# Patient Record
Sex: Female | Born: 1990
Health system: Southern US, Community
[De-identification: ages and names within clinical notes are randomized; demographics above are authoritative.]

## PROBLEM LIST (undated history)

## (undated) ENCOUNTER — Inpatient Hospital Stay (HOSPITAL_COMMUNITY): Payer: Self-pay

## (undated) HISTORY — PX: WISDOM TOOTH EXTRACTION: SHX21

## (undated) HISTORY — PX: HERNIA REPAIR: SHX51

---

## 2014-10-18 LAB — OB RESULTS CONSOLE GC/CHLAMYDIA
CHLAMYDIA, DNA PROBE: NEGATIVE
CHLAMYDIA, DNA PROBE: NEGATIVE
GC PROBE AMP, GENITAL: NEGATIVE
Gonorrhea: NEGATIVE

## 2015-01-20 DIAGNOSIS — Z3401 Encounter for supervision of normal first pregnancy, first trimester: Secondary | ICD-10-CM | POA: Diagnosis not present

## 2015-01-20 DIAGNOSIS — Z36 Encounter for antenatal screening of mother: Secondary | ICD-10-CM | POA: Diagnosis not present

## 2015-01-20 DIAGNOSIS — Z3A09 9 weeks gestation of pregnancy: Secondary | ICD-10-CM | POA: Diagnosis not present

## 2015-01-20 LAB — OB RESULTS CONSOLE HIV ANTIBODY (ROUTINE TESTING): HIV: NONREACTIVE

## 2015-01-20 LAB — OB RESULTS CONSOLE HEPATITIS B SURFACE ANTIGEN: Hepatitis B Surface Ag: NEGATIVE

## 2015-01-20 LAB — OB RESULTS CONSOLE RUBELLA ANTIBODY, IGM: Rubella: IMMUNE

## 2015-02-01 DIAGNOSIS — Z3401 Encounter for supervision of normal first pregnancy, first trimester: Secondary | ICD-10-CM | POA: Diagnosis not present

## 2015-02-08 DIAGNOSIS — Z36 Encounter for antenatal screening of mother: Secondary | ICD-10-CM | POA: Diagnosis not present

## 2015-02-08 DIAGNOSIS — Z3491 Encounter for supervision of normal pregnancy, unspecified, first trimester: Secondary | ICD-10-CM | POA: Diagnosis not present

## 2015-03-01 DIAGNOSIS — Z36 Encounter for antenatal screening of mother: Secondary | ICD-10-CM | POA: Diagnosis not present

## 2015-03-30 DIAGNOSIS — Z3400 Encounter for supervision of normal first pregnancy, unspecified trimester: Secondary | ICD-10-CM | POA: Diagnosis not present

## 2015-03-30 DIAGNOSIS — Z36 Encounter for antenatal screening of mother: Secondary | ICD-10-CM | POA: Diagnosis not present

## 2015-05-27 DIAGNOSIS — Z23 Encounter for immunization: Secondary | ICD-10-CM | POA: Diagnosis not present

## 2015-05-27 DIAGNOSIS — Z36 Encounter for antenatal screening of mother: Secondary | ICD-10-CM | POA: Diagnosis not present

## 2015-05-27 DIAGNOSIS — D509 Iron deficiency anemia, unspecified: Secondary | ICD-10-CM | POA: Diagnosis not present

## 2015-05-27 DIAGNOSIS — Z3400 Encounter for supervision of normal first pregnancy, unspecified trimester: Secondary | ICD-10-CM | POA: Diagnosis not present

## 2015-06-08 DIAGNOSIS — O360910 Maternal care for other rhesus isoimmunization, first trimester, not applicable or unspecified: Secondary | ICD-10-CM | POA: Diagnosis not present

## 2015-06-08 DIAGNOSIS — Z3A29 29 weeks gestation of pregnancy: Secondary | ICD-10-CM | POA: Diagnosis not present

## 2015-06-08 DIAGNOSIS — Z3493 Encounter for supervision of normal pregnancy, unspecified, third trimester: Secondary | ICD-10-CM | POA: Diagnosis not present

## 2015-07-26 DIAGNOSIS — Z113 Encounter for screening for infections with a predominantly sexual mode of transmission: Secondary | ICD-10-CM | POA: Diagnosis not present

## 2015-07-26 DIAGNOSIS — Z3400 Encounter for supervision of normal first pregnancy, unspecified trimester: Secondary | ICD-10-CM | POA: Diagnosis not present

## 2015-07-26 DIAGNOSIS — Z36 Encounter for antenatal screening of mother: Secondary | ICD-10-CM | POA: Diagnosis not present

## 2015-07-26 LAB — OB RESULTS CONSOLE GBS: GBS: POSITIVE

## 2015-08-16 ENCOUNTER — Inpatient Hospital Stay (HOSPITAL_COMMUNITY)
Admission: AD | Admit: 2015-08-16 | Discharge: 2015-08-16 | Disposition: A | Discharge status: Home / Self Care | Payer: 59 | Source: Ambulatory Visit | Attending: Obstetrics and Gynecology | Admitting: Obstetrics and Gynecology

## 2015-08-16 ENCOUNTER — Encounter (HOSPITAL_COMMUNITY): Payer: Self-pay

## 2015-08-16 DIAGNOSIS — Z3A39 39 weeks gestation of pregnancy: Secondary | ICD-10-CM | POA: Diagnosis not present

## 2015-08-16 DIAGNOSIS — Z3493 Encounter for supervision of normal pregnancy, unspecified, third trimester: Secondary | ICD-10-CM

## 2015-08-16 DIAGNOSIS — D62 Acute posthemorrhagic anemia: Secondary | ICD-10-CM | POA: Diagnosis not present

## 2015-08-16 DIAGNOSIS — O9081 Anemia of the puerperium: Secondary | ICD-10-CM | POA: Diagnosis not present

## 2015-08-16 NOTE — Discharge Instructions (Signed)
Third Trimester of Pregnancy °The third trimester is from week 29 through week 42, months 7 through 9. The third trimester is a time when the fetus is growing rapidly. At the end of the ninth month, the fetus is about 20 inches in length and weighs 6-10 pounds.  °BODY CHANGES °Your body goes through many changes during pregnancy. The changes vary from woman to woman.  °· Your weight will continue to increase. You can expect to gain 25-35 pounds (11-16 kg) by the end of the pregnancy. °· You may begin to get stretch marks on your hips, abdomen, and breasts. °· You may urinate more often because the fetus is moving lower into your pelvis and pressing on your bladder. °· You may develop or continue to have heartburn as a result of your pregnancy. °· You may develop constipation because certain hormones are causing the muscles that push waste through your intestines to slow down. °· You may develop hemorrhoids or swollen, bulging veins (varicose veins). °· You may have pelvic pain because of the weight gain and pregnancy hormones relaxing your joints between the bones in your pelvis. Backaches may result from overexertion of the muscles supporting your posture. °· You may have changes in your hair. These can include thickening of your hair, rapid growth, and changes in texture. Some women also have hair loss during or after pregnancy, or hair that feels dry or thin. Your hair will most likely return to normal after your baby is born. °· Your breasts will continue to grow and be tender. A yellow discharge may leak from your breasts called colostrum. °· Your belly button may stick out. °· You may feel short of breath because of your expanding uterus. °· You may notice the fetus "dropping," or moving lower in your abdomen. °· You may have a bloody mucus discharge. This usually occurs a few days to a week before labor begins. °· Your cervix becomes thin and soft (effaced) near your due date. °WHAT TO EXPECT AT YOUR PRENATAL  EXAMS  °You will have prenatal exams every 2 weeks until week 36. Then, you will have weekly prenatal exams. During a routine prenatal visit: °· You will be weighed to make sure you and the fetus are growing normally. °· Your blood pressure is taken. °· Your abdomen will be measured to track your baby's growth. °· The fetal heartbeat will be listened to. °· Any test results from the previous visit will be discussed. °· You may have a cervical check near your due date to see if you have effaced. °At around 36 weeks, your caregiver will check your cervix. At the same time, your caregiver will also perform a test on the secretions of the vaginal tissue. This test is to determine if a type of bacteria, Group B streptococcus, is present. Your caregiver will explain this further. °Your caregiver may ask you: °· What your birth plan is. °· How you are feeling. °· If you are feeling the baby move. °· If you have had any abnormal symptoms, such as leaking fluid, bleeding, severe headaches, or abdominal cramping. °· If you are using any tobacco products, including cigarettes, chewing tobacco, and electronic cigarettes. °· If you have any questions. °Other tests or screenings that may be performed during your third trimester include: °· Blood tests that check for low iron levels (anemia). °· Fetal testing to check the health, activity level, and growth of the fetus. Testing is done if you have certain medical conditions or if   there are problems during the pregnancy. °· HIV (human immunodeficiency virus) testing. If you are at high risk, you may be screened for HIV during your third trimester of pregnancy. °FALSE LABOR °You may feel small, irregular contractions that eventually go away. These are called Braxton Hicks contractions, or false labor. Contractions may last for hours, days, or even weeks before true labor sets in. If contractions come at regular intervals, intensify, or become painful, it is best to be seen by your  caregiver.  °SIGNS OF LABOR  °· Menstrual-like cramps. °· Contractions that are 5 minutes apart or less. °· Contractions that start on the top of the uterus and spread down to the lower abdomen and back. °· A sense of increased pelvic pressure or back pain. °· A watery or bloody mucus discharge that comes from the vagina. °If you have any of these signs before the 37th week of pregnancy, call your caregiver right away. You need to go to the hospital to get checked immediately. °HOME CARE INSTRUCTIONS  °· Avoid all smoking, herbs, alcohol, and unprescribed drugs. These chemicals affect the formation and growth of the baby. °· Do not use any tobacco products, including cigarettes, chewing tobacco, and electronic cigarettes. If you need help quitting, ask your health care provider. You may receive counseling support and other resources to help you quit. °· Follow your caregiver's instructions regarding medicine use. There are medicines that are either safe or unsafe to take during pregnancy. °· Exercise only as directed by your caregiver. Experiencing uterine cramps is a good sign to stop exercising. °· Continue to eat regular, healthy meals. °· Wear a good support bra for breast tenderness. °· Do not use hot tubs, steam rooms, or saunas. °· Wear your seat belt at all times when driving. °· Avoid raw meat, uncooked cheese, cat litter boxes, and soil used by cats. These carry germs that can cause birth defects in the baby. °· Take your prenatal vitamins. °· Take 1500-2000 mg of calcium daily starting at the 20th week of pregnancy until you deliver your baby. °· Try taking a stool softener (if your caregiver approves) if you develop constipation. Eat more high-fiber foods, such as fresh vegetables or fruit and whole grains. Drink plenty of fluids to keep your urine clear or pale yellow. °· Take warm sitz baths to soothe any pain or discomfort caused by hemorrhoids. Use hemorrhoid cream if your caregiver approves. °· If  you develop varicose veins, wear support hose. Elevate your feet for 15 minutes, 3-4 times a day. Limit salt in your diet. °· Avoid heavy lifting, wear low heal shoes, and practice good posture. °· Rest a lot with your legs elevated if you have leg cramps or low back pain. °· Visit your dentist if you have not gone during your pregnancy. Use a soft toothbrush to brush your teeth and be gentle when you floss. °· A sexual relationship may be continued unless your caregiver directs you otherwise. °· Do not travel far distances unless it is absolutely necessary and only with the approval of your caregiver. °· Take prenatal classes to understand, practice, and ask questions about the labor and delivery. °· Make a trial run to the hospital. °· Pack your hospital bag. °· Prepare the baby's nursery. °· Continue to go to all your prenatal visits as directed by your caregiver. °SEEK MEDICAL CARE IF: °· You are unsure if you are in labor or if your water has broken. °· You have dizziness. °· You have   mild pelvic cramps, pelvic pressure, or nagging pain in your abdominal area. °· You have persistent nausea, vomiting, or diarrhea. °· You have a bad smelling vaginal discharge. °· You have pain with urination. °SEEK IMMEDIATE MEDICAL CARE IF:  °· You have a fever. °· You are leaking fluid from your vagina. °· You have spotting or bleeding from your vagina. °· You have severe abdominal cramping or pain. °· You have rapid weight loss or gain. °· You have shortness of breath with chest pain. °· You notice sudden or extreme swelling of your face, hands, ankles, feet, or legs. °· You have not felt your baby move in over an hour. °· You have severe headaches that do not go away with medicine. °· You have vision changes. °  °This information is not intended to replace advice given to you by your health care provider. Make sure you discuss any questions you have with your health care provider. °  °Document Released: 12/26/2000 Document  Revised: 01/22/2014 Document Reviewed: 03/04/2012 °[employer] Interactive Patient Education ©2016 [employer] °Fetal Movement Counts °Patient Name: __________________________________________________ Patient Due Date: ____________________ °Performing a fetal movement count is highly recommended in high-risk pregnancies, but it is good for every pregnant woman to do. Your health care provider may ask you to start counting fetal movements at 28 weeks of the pregnancy. Fetal movements often increase: °· After eating a full meal. °· After physical activity. °· After eating or drinking something sweet or cold. °· At rest. °Pay attention to when you feel the baby is most active. This will help you notice a pattern of your baby's sleep and wake cycles and what factors contribute to an increase in fetal movement. It is important to perform a fetal movement count at the same time each day when your baby is normally most active.  °HOW TO COUNT FETAL MOVEMENTS °1. Find a quiet and comfortable area to sit or lie down on your left side. Lying on your left side provides the best blood and oxygen circulation to your baby. °2. Write down the day and time on a sheet of paper or in a journal. °3. Start counting kicks, flutters, swishes, rolls, or jabs in a 2-hour period. You should feel at least 10 movements within 2 hours. °4. If you do not feel 10 movements in 2 hours, wait 2-3 hours and count again. Look for a change in the pattern or not enough counts in 2 hours. °SEEK MEDICAL CARE IF: °· You feel less than 10 counts in 2 hours, tried twice. °· There is no movement in over an hour. °· The pattern is changing or taking longer each day to reach 10 counts in 2 hours. °· You feel the baby is not moving as he or she usually does. °Date: ____________ Movements: ____________ Start time: ____________ Finish time: ____________  °Date: ____________ Movements: ____________ Start time: ____________ Finish time: ____________ °Date:  ____________ Movements: ____________ Start time: ____________ Finish time: ____________ °Date: ____________ Movements: ____________ Start time: ____________ Finish time: ____________ °Date: ____________ Movements: ____________ Start time: ____________ Finish time: ____________ °Date: ____________ Movements: ____________ Start time: ____________ Finish time: ____________ °Date: ____________ Movements: ____________ Start time: ____________ Finish time: ____________ °Date: ____________ Movements: ____________ Start time: ____________ Finish time: ____________  °Date: ____________ Movements: ____________ Start time: ____________ Finish time: ____________ °Date: ____________ Movements: ____________ Start time: ____________ Finish time: ____________ °Date: ____________ Movements: ____________ Start time: ____________ Finish time: ____________ °Date: ____________ Movements: ____________ Start time: ____________ Finish time: ____________ °Date:   ____________ Movements: ____________ Start time: ____________ Finish time: ____________ °Date: ____________ Movements: ____________ Start time: ____________ Finish time: ____________ °Date: ____________ Movements: ____________ Start time: ____________ Finish time: ____________  °Date: ____________ Movements: ____________ Start time: ____________ Finish time: ____________ °Date: ____________ Movements: ____________ Start time: ____________ Finish time: ____________ °Date: ____________ Movements: ____________ Start time: ____________ Finish time: ____________ °Date: ____________ Movements: ____________ Start time: ____________ Finish time: ____________ °Date: ____________ Movements: ____________ Start time: ____________ Finish time: ____________ °Date: ____________ Movements: ____________ Start time: ____________ Finish time: ____________ °Date: ____________ Movements: ____________ Start time: ____________ Finish time: ____________  °Date: ____________ Movements: ____________ Start  time: ____________ Finish time: ____________ °Date: ____________ Movements: ____________ Start time: ____________ Finish time: ____________ °Date: ____________ Movements: ____________ Start time: ____________ Finish time: ____________ °Date: ____________ Movements: ____________ Start time: ____________ Finish time: ____________ °Date: ____________ Movements: ____________ Start time: ____________ Finish time: ____________ °Date: ____________ Movements: ____________ Start time: ____________ Finish time: ____________ °Date: ____________ Movements: ____________ Start time: ____________ Finish time: ____________  °Date: ____________ Movements: ____________ Start time: ____________ Finish time: ____________ °Date: ____________ Movements: ____________ Start time: ____________ Finish time: ____________ °Date: ____________ Movements: ____________ Start time: ____________ Finish time: ____________ °Date: ____________ Movements: ____________ Start time: ____________ Finish time: ____________ °Date: ____________ Movements: ____________ Start time: ____________ Finish time: ____________ °Date: ____________ Movements: ____________ Start time: ____________ Finish time: ____________ °Date: ____________ Movements: ____________ Start time: ____________ Finish time: ____________  °Date: ____________ Movements: ____________ Start time: ____________ Finish time: ____________ °Date: ____________ Movements: ____________ Start time: ____________ Finish time: ____________ °Date: ____________ Movements: ____________ Start time: ____________ Finish time: ____________ °Date: ____________ Movements: ____________ Start time: ____________ Finish time: ____________ °Date: ____________ Movements: ____________ Start time: ____________ Finish time: ____________ °Date: ____________ Movements: ____________ Start time: ____________ Finish time: ____________ °Date: ____________ Movements: ____________ Start time: ____________ Finish time: ____________    °Date: ____________ Movements: ____________ Start time: ____________ Finish time: ____________ °Date: ____________ Movements: ____________ Start time: ____________ Finish time: ____________ °Date: ____________ Movements: ____________ Start time: ____________ Finish time: ____________ °Date: ____________ Movements: ____________ Start time: ____________ Finish time: ____________ °Date: ____________ Movements: ____________ Start time: ____________ Finish time: ____________ °Date: ____________ Movements: ____________ Start time: ____________ Finish time: ____________ °Date: ____________ Movements: ____________ Start time: ____________ Finish time: ____________  °Date: ____________ Movements: ____________ Start time: ____________ Finish time: ____________ °Date: ____________ Movements: ____________ Start time: ____________ Finish time: ____________ °Date: ____________ Movements: ____________ Start time: ____________ Finish time: ____________ °Date: ____________ Movements: ____________ Start time: ____________ Finish time: ____________ °Date: ____________ Movements: ____________ Start time: ____________ Finish time: ____________ °Date: ____________ Movements: ____________ Start time: ____________ Finish time: ____________ °  °This information is not intended to replace advice given to you by your health care provider. Make sure you discuss any questions you have with your health care provider. °  °Document Released: 01/31/2006 Document Revised: 01/22/2014 Document Reviewed: 10/29/2011 °[employer] Interactive Patient Education ©2016 [employer] °Braxton Hicks Contractions °Contractions of the uterus can occur throughout pregnancy. Contractions are not always a sign that you are in labor.  °WHAT ARE BRAXTON HICKS CONTRACTIONS?  °Contractions that occur before labor are called Braxton Hicks contractions, or false labor. Toward the end of pregnancy (32-34 weeks), these contractions can develop more often and may become more  forceful. This is not true labor because these contractions do not result in opening (dilatation) and thinning of the cervix. They are sometimes difficult to tell apart from true labor because these contractions can be forceful and people have different pain tolerances. You   should not feel embarrassed if you go to the hospital with false labor. Sometimes, the only way to tell if you are in true labor is for your health care provider to look for changes in the cervix. °If there are no prenatal problems or other health problems associated with the pregnancy, it is completely safe to be sent home with false labor and await the onset of true labor. °HOW CAN YOU TELL THE DIFFERENCE BETWEEN TRUE AND FALSE LABOR? °False Labor °· The contractions of false labor are usually shorter and not as hard as those of true labor.   °· The contractions are usually irregular.   °· The contractions are often felt in the front of the lower abdomen and in the groin.   °· The contractions may go away when you walk around or change positions while lying down.   °· The contractions get weaker and are shorter lasting as time goes on.   °· The contractions do not usually become progressively stronger, regular, and closer together as with true labor.   °True Labor °· Contractions in true labor last 30-70 seconds, become very regular, usually become more intense, and increase in frequency.   °· The contractions do not go away with walking.   °· The discomfort is usually felt in the top of the uterus and spreads to the lower abdomen and low back.   °· True labor can be determined by your health care provider with an exam. This will show that the cervix is dilating and getting thinner.   °WHAT TO REMEMBER °· Keep up with your usual exercises and follow other instructions given by your health care provider.   °· Take medicines as directed by your health care provider.   °· Keep your regular prenatal appointments.   °· Eat and drink lightly if you  think you are going into labor.   °· If Braxton Hicks contractions are making you uncomfortable:   °¨ Change your position from lying down or resting to walking, or from walking to resting.   °¨ Sit and rest in a tub of warm water.   °¨ Drink 2-3 glasses of water. Dehydration may cause these contractions.   °¨ Do slow and deep breathing several times an hour.   °WHEN SHOULD I SEEK IMMEDIATE MEDICAL CARE? °Seek immediate medical care if: °· Your contractions become stronger, more regular, and closer together.   °· You have fluid leaking or gushing from your vagina.   °· You have a fever.   °· You pass blood-tinged mucus.   °· You have vaginal bleeding.   °· You have continuous abdominal pain.   °· You have low back pain that you never had before.   °· You feel your baby's head pushing down and causing pelvic pressure.   °· Your baby is not moving as much as it used to.   °  °This information is not intended to replace advice given to you by your health care provider. Make sure you discuss any questions you have with your health care provider. °  °Document Released: 01/01/2005 Document Revised: 01/06/2013 Document Reviewed: 10/13/2012 °[employer] Interactive Patient Education ©2016 [employer] ° °

## 2015-08-16 NOTE — Progress Notes (Signed)
Notified of pt arrival in MAU and complaint with cervical exam and minimal bleeding. Will discharge home with labor precautions

## 2015-08-17 ENCOUNTER — Inpatient Hospital Stay (HOSPITAL_COMMUNITY)
Admission: AD | Admit: 2015-08-17 | Discharge: 2015-08-20 | DRG: 774 | Disposition: A | Discharge status: Home / Self Care | Payer: 59 | Source: Ambulatory Visit | Attending: Obstetrics and Gynecology | Admitting: Obstetrics and Gynecology

## 2015-08-17 ENCOUNTER — Inpatient Hospital Stay (HOSPITAL_COMMUNITY): Payer: 59 | Admitting: Anesthesiology

## 2015-08-17 ENCOUNTER — Encounter (HOSPITAL_COMMUNITY)
Admission: AD | Disposition: A | Discharge status: Home / Self Care | Payer: Self-pay | Source: Ambulatory Visit | Attending: Obstetrics and Gynecology

## 2015-08-17 ENCOUNTER — Encounter (HOSPITAL_COMMUNITY): Payer: Self-pay

## 2015-08-17 DIAGNOSIS — D62 Acute posthemorrhagic anemia: Secondary | ICD-10-CM | POA: Diagnosis not present

## 2015-08-17 DIAGNOSIS — Z3403 Encounter for supervision of normal first pregnancy, third trimester: Secondary | ICD-10-CM | POA: Diagnosis present

## 2015-08-17 DIAGNOSIS — O9081 Anemia of the puerperium: Secondary | ICD-10-CM | POA: Diagnosis not present

## 2015-08-17 DIAGNOSIS — Z3A39 39 weeks gestation of pregnancy: Secondary | ICD-10-CM | POA: Diagnosis not present

## 2015-08-17 HISTORY — PX: DILATION AND CURETTAGE OF UTERUS: SHX78

## 2015-08-17 LAB — CBC
HCT: 36.6 % (ref 36.0–46.0)
HCT: 25.4 % — ABNORMAL LOW (ref 36.0–46.0)
HEMOGLOBIN: 12.5 g/dL (ref 12.0–15.0)
Hemoglobin: 8.5 g/dL — ABNORMAL LOW (ref 12.0–15.0)
MCH: 30.3 pg (ref 26.0–34.0)
MCH: 30.1 pg (ref 26.0–34.0)
MCHC: 34.2 g/dL (ref 30.0–36.0)
MCHC: 33.5 g/dL (ref 30.0–36.0)
MCV: 88.6 fL (ref 78.0–100.0)
MCV: 90.1 fL (ref 78.0–100.0)
PLATELETS: 188 10*3/uL (ref 150–400)
Platelets: 198 10*3/uL (ref 150–400)
RBC: 4.13 MIL/uL (ref 3.87–5.11)
RBC: 2.82 MIL/uL — ABNORMAL LOW (ref 3.87–5.11)
RDW: 13.9 % (ref 11.5–15.5)
RDW: 14 % (ref 11.5–15.5)
WBC: 15.9 10*3/uL — ABNORMAL HIGH (ref 4.0–10.5)
WBC: 24.9 10*3/uL — AB (ref 4.0–10.5)

## 2015-08-17 LAB — PREPARE RBC (CROSSMATCH)

## 2015-08-17 SURGERY — DILATION AND CURETTAGE
Anesthesia: Epidural | Site: Vagina

## 2015-08-17 MED ORDER — METHYLERGONOVINE MALEATE 0.2 MG/ML IJ SOLN
INTRAMUSCULAR | Status: AC
  Administered 2015-08-17: 20:00:00
  Filled 2015-08-17: qty 1

## 2015-08-17 MED ORDER — ONDANSETRON HCL 4 MG/2ML IJ SOLN
4.0000 mg | Freq: Once | INTRAMUSCULAR | Status: AC | PRN
  Administered 2015-08-17: 4 mg via INTRAVENOUS

## 2015-08-17 MED ORDER — ONDANSETRON HCL 4 MG/2ML IJ SOLN
INTRAMUSCULAR | Status: AC
  Filled 2015-08-17: qty 2

## 2015-08-17 MED ORDER — HYDROMORPHONE HCL 1 MG/ML IJ SOLN: 0.2500 mg | INTRAMUSCULAR | Status: DC | PRN

## 2015-08-17 MED ORDER — SOD CITRATE-CITRIC ACID 500-334 MG/5ML PO SOLN
30.0000 mL | ORAL | Status: DC | PRN
  Administered 2015-08-17 (×2): 30 mL via ORAL
  Filled 2015-08-17 (×2): qty 15

## 2015-08-17 MED ORDER — FLEET ENEMA 7-19 GM/118ML RE ENEM: 1.0000 | ENEMA | RECTAL | Status: DC | PRN

## 2015-08-17 MED ORDER — OXYTOCIN 40 UNITS IN LACTATED RINGERS INFUSION - SIMPLE MED
1.0000 m[IU]/min | INTRAVENOUS | Status: DC
  Administered 2015-08-17: 2 m[IU]/min via INTRAVENOUS

## 2015-08-17 MED ORDER — PHENYLEPHRINE 40 MCG/ML (10ML) SYRINGE FOR IV PUSH (FOR BLOOD PRESSURE SUPPORT)
80.0000 ug | PREFILLED_SYRINGE | INTRAVENOUS | Status: DC | PRN
  Filled 2015-08-17: qty 10

## 2015-08-17 MED ORDER — EPHEDRINE 5 MG/ML INJ: 10.0000 mg | INTRAVENOUS | Status: DC | PRN

## 2015-08-17 MED ORDER — SODIUM CHLORIDE 0.9 % IR SOLN
Status: DC | PRN
  Administered 2015-08-17: 1000 mL

## 2015-08-17 MED ORDER — BACITRACIN ZINC 500 UNIT/GM EX OINT
TOPICAL_OINTMENT | CUTANEOUS | Status: AC
  Filled 2015-08-17: qty 28.35

## 2015-08-17 MED ORDER — ACETAMINOPHEN 325 MG PO TABS: 650.0000 mg | ORAL_TABLET | ORAL | Status: DC | PRN

## 2015-08-17 MED ORDER — BACITRACIN ZINC 500 UNIT/GM EX OINT
TOPICAL_OINTMENT | CUTANEOUS | Status: DC | PRN
  Administered 2015-08-17: 1 via TOPICAL

## 2015-08-17 MED ORDER — LACTATED RINGERS IV SOLN: INTRAVENOUS | Status: DC | PRN

## 2015-08-17 MED ORDER — LACTATED RINGERS IV SOLN: 500.0000 mL | INTRAVENOUS | Status: DC | PRN

## 2015-08-17 MED ORDER — SODIUM CHLORIDE 0.9 % IV SOLN
INTRAVENOUS | Status: DC | PRN
  Administered 2015-08-17: 21:00:00 via INTRAVENOUS

## 2015-08-17 MED ORDER — OXYTOCIN 40 UNITS IN LACTATED RINGERS INFUSION - SIMPLE MED
2.5000 [IU]/h | INTRAVENOUS | Status: DC
Start: 2015-08-17 — End: 2015-08-18
  Filled 2015-08-17: qty 1000

## 2015-08-17 MED ORDER — OXYCODONE HCL 5 MG PO TABS: 5.0000 mg | ORAL_TABLET | Freq: Once | ORAL | Status: DC | PRN

## 2015-08-17 MED ORDER — TERBUTALINE SULFATE 1 MG/ML IJ SOLN: 0.2500 mg | Freq: Once | INTRAMUSCULAR | Status: DC | PRN

## 2015-08-17 MED ORDER — MIDAZOLAM HCL 2 MG/2ML IJ SOLN
INTRAMUSCULAR | Status: AC
  Filled 2015-08-17: qty 2

## 2015-08-17 MED ORDER — DIPHENHYDRAMINE HCL 50 MG/ML IJ SOLN: 12.5000 mg | INTRAMUSCULAR | Status: DC | PRN

## 2015-08-17 MED ORDER — LIDOCAINE HCL (PF) 1 % IJ SOLN
INTRAMUSCULAR | Status: DC | PRN
  Administered 2015-08-17 (×2): 7 mL via EPIDURAL

## 2015-08-17 MED ORDER — MEPERIDINE HCL 25 MG/ML IJ SOLN
6.2500 mg | INTRAMUSCULAR | Status: DC | PRN
Start: 2015-08-17 — End: 2015-08-18

## 2015-08-17 MED ORDER — OXYCODONE HCL 5 MG/5ML PO SOLN: 5.0000 mg | Freq: Once | ORAL | Status: DC | PRN

## 2015-08-17 MED ORDER — PHENYLEPHRINE HCL 10 MG/ML IJ SOLN
INTRAMUSCULAR | Status: DC | PRN
  Administered 2015-08-17 (×2): 80 ug via INTRAVENOUS

## 2015-08-17 MED ORDER — LIDOCAINE HCL (PF) 1 % IJ SOLN
30.0000 mL | INTRAMUSCULAR | Status: DC | PRN
  Filled 2015-08-17: qty 30

## 2015-08-17 MED ORDER — PHENYLEPHRINE 40 MCG/ML (10ML) SYRINGE FOR IV PUSH (FOR BLOOD PRESSURE SUPPORT): 80.0000 ug | PREFILLED_SYRINGE | INTRAVENOUS | Status: DC | PRN

## 2015-08-17 MED ORDER — MIDAZOLAM HCL 2 MG/2ML IJ SOLN
INTRAMUSCULAR | Status: DC | PRN
  Administered 2015-08-17: 2 mg via INTRAVENOUS

## 2015-08-17 MED ORDER — OXYCODONE-ACETAMINOPHEN 5-325 MG PO TABS: 1.0000 | ORAL_TABLET | ORAL | Status: DC | PRN

## 2015-08-17 MED ORDER — SODIUM BICARBONATE 8.4 % IV SOLN
INTRAVENOUS | Status: DC | PRN
  Administered 2015-08-17 (×2): 5 mL via EPIDURAL

## 2015-08-17 MED ORDER — OXYTOCIN BOLUS FROM INFUSION
500.0000 mL | Freq: Once | INTRAVENOUS | Status: AC
  Administered 2015-08-17: 500 mL via INTRAVENOUS

## 2015-08-17 MED ORDER — LACTATED RINGERS IV SOLN
500.0000 mL | Freq: Once | INTRAVENOUS | Status: AC
  Administered 2015-08-17 (×2): 500 mL via INTRAVENOUS

## 2015-08-17 MED ORDER — BUTORPHANOL TARTRATE 1 MG/ML IJ SOLN: 1.0000 mg | INTRAMUSCULAR | Status: DC | PRN

## 2015-08-17 MED ORDER — FENTANYL 2.5 MCG/ML BUPIVACAINE 1/10 % EPIDURAL INFUSION (WH - ANES)
14.0000 mL/h | INTRAMUSCULAR | Status: DC | PRN
  Administered 2015-08-17 (×2): 14 mL/h via EPIDURAL
  Filled 2015-08-17 (×2): qty 125

## 2015-08-17 MED ORDER — PENICILLIN G POTASSIUM 5000000 UNITS IJ SOLR
2.5000 10*6.[IU] | INTRAVENOUS | Status: DC
  Administered 2015-08-17 (×2): 2.5 10*6.[IU] via INTRAVENOUS
  Filled 2015-08-17 (×4): qty 2.5

## 2015-08-17 MED ORDER — FENTANYL CITRATE (PF) 100 MCG/2ML IJ SOLN
INTRAMUSCULAR | Status: AC
  Filled 2015-08-17: qty 2

## 2015-08-17 MED ORDER — PHENYLEPHRINE 40 MCG/ML (10ML) SYRINGE FOR IV PUSH (FOR BLOOD PRESSURE SUPPORT)
PREFILLED_SYRINGE | INTRAVENOUS | Status: AC
  Filled 2015-08-17: qty 10

## 2015-08-17 MED ORDER — FENTANYL CITRATE (PF) 100 MCG/2ML IJ SOLN
INTRAMUSCULAR | Status: DC | PRN
  Administered 2015-08-17 (×2): 50 ug via INTRAVENOUS

## 2015-08-17 MED ORDER — PENICILLIN G POTASSIUM 5000000 UNITS IJ SOLR
5.0000 10*6.[IU] | Freq: Once | INTRAVENOUS | Status: AC
  Administered 2015-08-17: 5 10*6.[IU] via INTRAVENOUS
  Filled 2015-08-17: qty 5

## 2015-08-17 MED ORDER — DEXTROSE 5 % IV SOLN
2.0000 g | Freq: Once | INTRAVENOUS | Status: AC
  Administered 2015-08-17: 2 g via INTRAVENOUS
  Filled 2015-08-17: qty 2

## 2015-08-17 MED ORDER — LACTATED RINGERS IV SOLN
INTRAVENOUS | Status: DC
  Administered 2015-08-17 (×3): via INTRAVENOUS

## 2015-08-17 MED ORDER — OXYCODONE-ACETAMINOPHEN 5-325 MG PO TABS: 2.0000 | ORAL_TABLET | ORAL | Status: DC | PRN

## 2015-08-17 MED ORDER — DEXTROSE 5 % IV SOLN: 1.0000 g | Freq: Two times a day (BID) | INTRAVENOUS | Status: DC

## 2015-08-17 MED ORDER — ESTRADIOL 0.1 MG/GM VA CREA
TOPICAL_CREAM | VAGINAL | Status: AC
  Filled 2015-08-17: qty 42.5

## 2015-08-17 MED ORDER — ONDANSETRON HCL 4 MG/2ML IJ SOLN
4.0000 mg | Freq: Four times a day (QID) | INTRAMUSCULAR | Status: DC | PRN
  Administered 2015-08-17: 4 mg via INTRAVENOUS
  Filled 2015-08-17: qty 2

## 2015-08-17 MED ORDER — ONDANSETRON HCL 4 MG/2ML IJ SOLN
INTRAMUSCULAR | Status: DC | PRN
  Administered 2015-08-17: 4 mg via INTRAVENOUS

## 2015-08-17 SURGICAL SUPPLY — 34 items
BALLN POSTPARTUM SOS BAKRI (BALLOONS) ×4
BALLOON POSTPARTUM SOS BAKRI (BALLOONS) ×2 IMPLANT
BNDG GAUZE ELAST 4 BULKY (GAUZE/BANDAGES/DRESSINGS) ×4 IMPLANT
CATH ROBINSON RED A/P 16FR (CATHETERS) IMPLANT
CLOTH BEACON ORANGE TIMEOUT ST (SAFETY) IMPLANT
CONTAINER PREFILL 10% NBF 60ML (FORM) IMPLANT
DECANTER SPIKE VIAL GLASS SM (MISCELLANEOUS) IMPLANT
GAUZE PACKING 1 X5 YD ST (GAUZE/BANDAGES/DRESSINGS) IMPLANT
GAUZE PACKING 1/2X5YD (GAUZE/BANDAGES/DRESSINGS) IMPLANT
GLOVE BIO SURGEON STRL SZ 6.5 (GLOVE) ×3 IMPLANT
GLOVE BIO SURGEONS STRL SZ 6.5 (GLOVE) ×1
GLOVE BIOGEL PI IND STRL 7.0 (GLOVE) ×4 IMPLANT
GLOVE BIOGEL PI IND STRL 9 (GLOVE) ×2 IMPLANT
GLOVE BIOGEL PI INDICATOR 7.0 (GLOVE) ×4
GLOVE BIOGEL PI INDICATOR 9 (GLOVE) ×2
GOWN STRL REUS W/TWL LRG LVL3 (GOWN DISPOSABLE) ×8 IMPLANT
GOWN STRL REUS W/TWL XL LVL3 (GOWN DISPOSABLE) ×4 IMPLANT
KIT BERKELEY 1ST TRIMESTER 3/8 (MISCELLANEOUS) ×4 IMPLANT
NS IRRIG 1000ML POUR BTL (IV SOLUTION) ×4 IMPLANT
PACK VAGINAL MINOR WOMEN LF (CUSTOM PROCEDURE TRAY) ×4 IMPLANT
PAD OB MATERNITY 4.3X12.25 (PERSONAL CARE ITEMS) ×4 IMPLANT
PAD PREP 24X48 CUFFED NSTRL (MISCELLANEOUS) ×4 IMPLANT
SET BERKELEY SUCTION TUBING (SUCTIONS) ×4 IMPLANT
SPONGE LAP 4X18 X RAY DECT (DISPOSABLE) ×4 IMPLANT
SUT VICRYL RAPIDE 3-0 36IN (SUTURE) ×8 IMPLANT
TOWEL OR 17X24 6PK STRL BLUE (TOWEL DISPOSABLE) ×8 IMPLANT
TRAY FOLEY CATH SILVER 14FR (SET/KITS/TRAYS/PACK) ×4 IMPLANT
TUBING SUCTION BULK 100 FT (MISCELLANEOUS) ×4 IMPLANT
VACURETTE 10 RIGID CVD (CANNULA) IMPLANT
VACURETTE 7MM CVD STRL WRAP (CANNULA) IMPLANT
VACURETTE 8 RIGID CVD (CANNULA) IMPLANT
VACURETTE 9 RIGID CVD (CANNULA) IMPLANT
WATER STERILE IRR 1000ML POUR (IV SOLUTION) IMPLANT
YANKAUER SUCT BULB TIP NO VENT (SUCTIONS) ×4 IMPLANT

## 2015-08-17 NOTE — Anesthesia Postprocedure Evaluation (Signed)
Anesthesia Post Note  Patient: Lynn Zamora  Procedure(s) Performed: Procedure(s) (LRB): DILATATION AND CURETTAGE (N/A)  Patient location during evaluation: PACU Anesthesia Type: Epidural Level of consciousness: awake and alert Pain management: satisfactory to patient Vital Signs Assessment: post-procedure vital signs reviewed and stable Respiratory status: spontaneous breathing Cardiovascular status: stable Postop Assessment: epidural receding Anesthetic complications: no Comments: Will leave epidural catheter in until tomorrow in case of further bleeding that needs surgical intervention.     Last Vitals:  Vitals:   08/17/15 2151 08/17/15 2200  BP: (!) 98/52 (!) 95/55  Pulse: (!) 101 (!) 101  Resp: 15 18  Temp: 36.8 C     Last Pain:  Vitals:   08/17/15 1930  TempSrc: Oral  PainSc:    Pain Goal:                 Kellyjo Edgren A

## 2015-08-17 NOTE — Progress Notes (Signed)
Notified of pt arrival in MAU and exam. Will admit to labor and delivery.  

## 2015-08-17 NOTE — Progress Notes (Addendum)
SVD of vigerous female infant w/ apgars of 6,9.  Placenta delivered spontaneous w/ 3VC.   2nd degree lac repaired w/ 3-0 vicryl rapide.  Fundus firm but continues to bleed heavily despite aggressive massage, IM methergine rec D&C for possible retained POC with possible bakri balloon placement  rba discussed, informed consent

## 2015-08-17 NOTE — Transfer of Care (Signed)
Immediate Anesthesia Transfer of Care Note  Patient: Lynn Zamora  Procedure(s) Performed: Procedure(s): DILATATION AND EVACUATION (N/A)  Patient Location: PACU  Anesthesia Type:Epidural  Level of Consciousness: awake, alert  and oriented  Airway & Oxygen Therapy: Patient Spontanous Breathing  Post-op Assessment: Report given to RN and Post -op Vital signs reviewed and stable  Post vital signs: Reviewed and stable  Last Vitals:  Vitals:   08/17/15 2015 08/17/15 2030  BP: 135/67 128/78  Pulse: (!) 112 (!) 106  Resp: 18 18  Temp:      Last Pain:  Vitals:   08/17/15 1930  TempSrc: Oral  PainSc:          Complications: No apparent anesthesia complications

## 2015-08-17 NOTE — Progress Notes (Signed)
Pt comfortable w/ epidural  FHT cat 1 Toco Q2-3 Cvx 4.5/90/-2 AROM - moderate mec  A/P:  Exp mngt

## 2015-08-17 NOTE — MAU Note (Signed)
Pt presents complaining of contractions that have worsened since last night. Still having bloody show. Denies leaking of fluid.

## 2015-08-17 NOTE — Anesthesia Procedure Notes (Signed)
Epidural Patient location during procedure: OB Start time: 08/17/2015 9:53 AM End time: 08/17/2015 9:57 AM  Staffing Anesthesiologist: Leilani Able Performed: anesthesiologist   Preanesthetic Checklist Completed: patient identified, site marked, surgical consent, pre-op evaluation, timeout performed, IV checked, risks and benefits discussed and monitors and equipment checked  Epidural Patient position: sitting Prep: site prepped and draped and DuraPrep Patient monitoring: continuous pulse ox and blood pressure Approach: midline Location: L3-L4 Injection technique: LOR air  Needle:  Needle type: Tuohy  Needle gauge: 17 G Needle length: 9 cm and 9 Needle insertion depth: 5 cm cm Catheter type: closed end flexible Catheter size: 19 Gauge Catheter at skin depth: 10 cm Test dose: negative and Other  Assessment Sensory level: T9 Events: blood not aspirated, injection not painful, no injection resistance, negative IV test and no paresthesia

## 2015-08-17 NOTE — Op Note (Signed)
NAMESUMYA, Lynn Zamora NO.:  [id number]  MEDICAL RECORD NO.:  [id number]  LOCATION:  [facility]                          FACILITY:  [facility]  PHYSICIAN:  Zelphia Cairo, MD    DATE OF BIRTH:  1990-01-29  DATE OF PROCEDURE: DATE OF DISCHARGE:                              OPERATIVE REPORT   PREOPERATIVE DIAGNOSIS:  Postpartum hemorrhage.  POSTOPERATIVE DIAGNOSIS:  Postpartum hemorrhage.  PROCEDURES: 1. Postpartum D and C. 2. Insertion of Bakri balloon.  SURGEON:  Zelphia Cairo, MD.  ASSISTANT:  Tilda Burrow, M.D.  ANESTHESIA:  Epidural with sedation.  COMPLICATIONS:  None.  CONDITION:  Stable to recovery room.  DESCRIPTION OF PROCEDURE:  The patient was taken to the operating room from Labor and delivery.  After postpartum hemorrhage did not respond to bimanual massage, and IM Methergine.  After informed consent was obtained, she was placed in the dorsal lithotomy position.  Her epidural was dosed.  She was prepped and draped in sterile fashion.  A Foley catheter was inserted.  Survey of the vagina did not reveal any cervical or high vaginal laceration.  The anterior lip of the cervix was grasped with ring forceps and the posterior lip was grasped as well.  A large banjo curette was used to remove any clot or retained products of conception.  Despite this, the patient continued to have persistent hemorrhage from the uterus.  The uterus was firm.  Bakri balloon was placed, and 200 mL of saline was placed in the Bakri balloon.  Tamponade was achieved.  The vagina was packed with Kerlix, and all instruments were removed.  She was taken to the recovery room in stable condition. Sponge, lap, needle, and instrument counts were correct x2.     Zelphia Cairo, MD    GA/MEDQ  D:  08/17/2015  T:  08/17/2015  Job:  [id number]

## 2015-08-17 NOTE — Anesthesia Preprocedure Evaluation (Signed)

## 2015-08-17 NOTE — H&P (Signed)
Lynn Zamora is a 25 y.o. female presenting for SOL.  Pregnancy uncomplicated.  OB History    Gravida Para Term Preterm AB Living   1             SAB TAB Ectopic Multiple Live Births                 History reviewed. No pertinent past medical history. Past Surgical History:  Procedure Laterality Date  . HERNIA REPAIR    . WISDOM TOOTH EXTRACTION     Family History: family history is not on file. Social History:  reports that she has never smoked. She has never used smokeless tobacco. She reports that she does not drink alcohol or use drugs.     Maternal Diabetes: No Genetic Screening: Normal Maternal Ultrasounds/Referrals: Normal Fetal Ultrasounds or other Referrals:  None Maternal Substance Abuse:  No Significant Maternal Medications:  None Significant Maternal Lab Results:  None Other Comments:  None  ROS History Dilation: 3 Effacement (%): 100 Station: -2 Exam by:: Sharen Hint RNC Blood pressure 136/88, pulse 98, temperature 98.1 F (36.7 C), temperature source Oral, resp. rate 18. Exam Physical Exam  Gen - uncomfortable w/ ctx abd - gravid Ext - NT Cvx 3cm Prenatal labs: ABO, Rh:   Antibody:   Rubella:   RPR:    HBsAg:    HIV:    GBS:   neg  Assessment/Plan: Admit Epidural prn Exp mngt   Jaasiel Hollyfield 08/17/2015, 8:13 AM

## 2015-08-18 ENCOUNTER — Encounter (HOSPITAL_COMMUNITY): Payer: Self-pay | Admitting: *Deleted

## 2015-08-18 LAB — CBC
HCT: 22.7 % — ABNORMAL LOW (ref 36.0–46.0)
HEMATOCRIT: 24.7 % — AB (ref 36.0–46.0)
HEMOGLOBIN: 8.6 g/dL — AB (ref 12.0–15.0)
Hemoglobin: 7.8 g/dL — ABNORMAL LOW (ref 12.0–15.0)
MCH: 29.9 pg (ref 26.0–34.0)
MCH: 30.5 pg (ref 26.0–34.0)
MCHC: 34.4 g/dL (ref 30.0–36.0)
MCHC: 34.8 g/dL (ref 30.0–36.0)
MCV: 85.8 fL (ref 78.0–100.0)
MCV: 88.7 fL (ref 78.0–100.0)
Platelets: 123 10*3/uL — ABNORMAL LOW (ref 150–400)
Platelets: 150 10*3/uL (ref 150–400)
RBC: 2.56 MIL/uL — ABNORMAL LOW (ref 3.87–5.11)
RBC: 2.88 MIL/uL — AB (ref 3.87–5.11)
RDW: 14 % (ref 11.5–15.5)
RDW: 14.9 % (ref 11.5–15.5)
WBC: 17.4 10*3/uL — ABNORMAL HIGH (ref 4.0–10.5)
WBC: 20.3 10*3/uL — AB (ref 4.0–10.5)

## 2015-08-18 LAB — PREPARE RBC (CROSSMATCH)

## 2015-08-18 LAB — FIBRINOGEN: Fibrinogen: 373 mg/dL (ref 210–475)

## 2015-08-18 LAB — RPR: RPR: NONREACTIVE

## 2015-08-18 MED ORDER — ACETAMINOPHEN 325 MG PO TABS: 650.0000 mg | ORAL_TABLET | ORAL | Status: DC | PRN

## 2015-08-18 MED ORDER — SODIUM CHLORIDE 0.9 % IV SOLN
510.0000 mg | Freq: Once | INTRAVENOUS | Status: AC
  Administered 2015-08-18: 510 mg via INTRAVENOUS
  Filled 2015-08-18: qty 17

## 2015-08-18 MED ORDER — IBUPROFEN 600 MG PO TABS
600.0000 mg | ORAL_TABLET | Freq: Four times a day (QID) | ORAL | Status: DC
  Administered 2015-08-18 – 2015-08-20 (×8): 600 mg via ORAL
  Filled 2015-08-18 (×8): qty 1

## 2015-08-18 MED ORDER — SODIUM CHLORIDE 0.9 % IV SOLN
Freq: Once | INTRAVENOUS | Status: AC
  Administered 2015-08-18: 01:00:00 via INTRAVENOUS

## 2015-08-18 MED ORDER — MEASLES, MUMPS & RUBELLA VAC ~~LOC~~ INJ
0.5000 mL | INJECTION | Freq: Once | SUBCUTANEOUS | Status: DC
  Filled 2015-08-18: qty 0.5

## 2015-08-18 MED ORDER — DEXTROSE IN LACTATED RINGERS 5 % IV SOLN: INTRAVENOUS | Status: DC

## 2015-08-18 MED ORDER — DEXTROSE IN LACTATED RINGERS 5 % IV SOLN
INTRAVENOUS | Status: DC
Start: 2015-08-18 — End: 2015-08-18

## 2015-08-18 MED ORDER — OXYCODONE-ACETAMINOPHEN 5-325 MG PO TABS
1.0000 | ORAL_TABLET | ORAL | Status: DC | PRN
  Administered 2015-08-18 – 2015-08-19 (×7): 1 via ORAL
  Filled 2015-08-18 (×7): qty 1

## 2015-08-18 MED ORDER — DEXTROSE IN LACTATED RINGERS 5 % IV SOLN
INTRAVENOUS | Status: DC
  Administered 2015-08-18 (×2): via INTRAVENOUS
  Administered 2015-08-19: 125 mL/h via INTRAVENOUS

## 2015-08-18 MED ORDER — MENTHOL 3 MG MT LOZG: 1.0000 | LOZENGE | OROMUCOSAL | Status: DC | PRN

## 2015-08-18 MED ORDER — SIMETHICONE 80 MG PO CHEW: 80.0000 mg | CHEWABLE_TABLET | ORAL | Status: DC | PRN

## 2015-08-18 MED ORDER — WITCH HAZEL-GLYCERIN EX PADS: 1.0000 "application " | MEDICATED_PAD | CUTANEOUS | Status: DC | PRN

## 2015-08-18 MED ORDER — SIMETHICONE 80 MG PO CHEW
80.0000 mg | CHEWABLE_TABLET | ORAL | Status: DC
  Administered 2015-08-18 – 2015-08-19 (×3): 80 mg via ORAL
  Filled 2015-08-18 (×3): qty 1

## 2015-08-18 MED ORDER — PRENATAL MULTIVITAMIN CH
1.0000 | ORAL_TABLET | Freq: Every day | ORAL | Status: DC
  Administered 2015-08-18 – 2015-08-19 (×2): 1 via ORAL
  Filled 2015-08-18 (×2): qty 1

## 2015-08-18 MED ORDER — CEFAZOLIN SODIUM-DEXTROSE 2-4 GM/100ML-% IV SOLN
2.0000 g | Freq: Three times a day (TID) | INTRAVENOUS | Status: DC
  Administered 2015-08-18 – 2015-08-19 (×5): 2 g via INTRAVENOUS
  Filled 2015-08-18 (×8): qty 100

## 2015-08-18 MED ORDER — DIPHENHYDRAMINE HCL 25 MG PO CAPS: 25.0000 mg | ORAL_CAPSULE | Freq: Four times a day (QID) | ORAL | Status: DC | PRN

## 2015-08-18 MED ORDER — SIMETHICONE 80 MG PO CHEW
80.0000 mg | CHEWABLE_TABLET | Freq: Three times a day (TID) | ORAL | Status: DC
  Administered 2015-08-18 – 2015-08-19 (×6): 80 mg via ORAL
  Filled 2015-08-18 (×6): qty 1

## 2015-08-18 MED ORDER — OXYTOCIN 40 UNITS IN LACTATED RINGERS INFUSION - SIMPLE MED: 2.5000 [IU]/h | INTRAVENOUS | Status: AC

## 2015-08-18 MED ORDER — OXYCODONE-ACETAMINOPHEN 5-325 MG PO TABS
2.0000 | ORAL_TABLET | ORAL | Status: DC | PRN
Start: 2015-08-18 — End: 2015-08-20
  Administered 2015-08-18 (×2): 2 via ORAL
  Filled 2015-08-18 (×2): qty 2

## 2015-08-18 MED ORDER — SENNOSIDES-DOCUSATE SODIUM 8.6-50 MG PO TABS
2.0000 | ORAL_TABLET | ORAL | Status: DC
  Administered 2015-08-18 – 2015-08-19 (×3): 2 via ORAL
  Filled 2015-08-18 (×3): qty 2

## 2015-08-18 MED ORDER — TETANUS-DIPHTH-ACELL PERTUSSIS 5-2.5-18.5 LF-MCG/0.5 IM SUSP
0.5000 mL | Freq: Once | INTRAMUSCULAR | Status: DC
  Filled 2015-08-18: qty 0.5

## 2015-08-18 MED ORDER — BENZOCAINE-MENTHOL 20-0.5 % EX AERO
1.0000 "application " | INHALATION_SPRAY | Freq: Four times a day (QID) | CUTANEOUS | Status: DC | PRN
  Administered 2015-08-18: 1 via TOPICAL
  Filled 2015-08-18: qty 56

## 2015-08-18 MED ORDER — COCONUT OIL OIL
1.0000 "application " | TOPICAL_OIL | Status: DC | PRN
  Filled 2015-08-18: qty 120

## 2015-08-18 MED ORDER — DIBUCAINE 1 % RE OINT: 1.0000 "application " | TOPICAL_OINTMENT | RECTAL | Status: DC | PRN

## 2015-08-18 MED ORDER — MEDROXYPROGESTERONE ACETATE 150 MG/ML IM SUSP: 150.0000 mg | INTRAMUSCULAR | Status: DC | PRN

## 2015-08-18 NOTE — Anesthesia Postprocedure Evaluation (Signed)
Anesthesia Post Note  Patient: Lynn Zamora  Procedure(s) Performed: * No procedures listed *  Patient location during evaluation: Mother Baby Anesthesia Type: Epidural Level of consciousness: awake and alert Pain management: pain level controlled Vital Signs Assessment: post-procedure vital signs reviewed and stable Respiratory status: spontaneous breathing, nonlabored ventilation and respiratory function stable Cardiovascular status: stable Postop Assessment: no headache, no backache and epidural receding Anesthetic complications: no     Last Vitals:  Vitals:   08/18/15 0000 08/18/15 0015  BP: (!) 99/59 110/62  Pulse: (!) 110 (!) 110  Resp: 17 18  Temp: 36.5 C     Last Pain:  Vitals:   08/18/15 0015  TempSrc:   PainSc: 2    Pain Goal:                 Kristin Lamagna A

## 2015-08-18 NOTE — Progress Notes (Signed)
Patient is doing well. Minimal pain.  Bleeding is minimal  BP 109/65 Comment: patient woken up, and cuff repositioned.   Pulse (!) 108   Temp 98.6 F (37 C) (Oral)   Resp 18   Ht 5\' 3"  (1.6 m)   Wt 84.4 kg (186 lb)   SpO2 100%   Breastfeeding? Unknown   BMI 32.95 kg/m  60 cc fluid removed from [facility] No active bleeding  Impression: PPD #1  PPH Only 100 cc fluid remaining in Bakri - remove more in am Feraheme infusion tonight CBC in am

## 2015-08-18 NOTE — Psychosocial Assessment (Signed)
Patient is doing better.  BP 109/66   Pulse 99   Temp 98.3 F (36.8 C) (Oral)   Resp 18   Ht  (1.6 m)   Wt 84.4 kg (186 lb)   SpO2 100%   Breastfeeding? Unknown   BMI 32.95 kg/m  Results for orders placed or performed during the hospital encounter of 08/17/15 (from the past 24 hour(s))  CBC     Status: Abnormal   Collection Time: 08/17/15  8:50 AM  Result Value Ref Range   WBC 15.9 (H) 4.0 - 10.5 K/uL   RBC 4.13 3.87 - 5.11 MIL/uL   Hemoglobin 12.5 12.0 - 15.0 g/dL   HCT 14.7 82.9 - 56.2 %   MCV 88.6 78.0 - 100.0 fL   MCH 30.3 26.0 - 34.0 pg   MCHC 34.2 30.0 - 36.0 g/dL   RDW 13.0 86.5 - 78.4 %   Platelets 198 150 - 400 K/uL  Type and screen [facility] [place] [facility]     Status: None (Preliminary result)   Collection Time: 08/17/15  8:50 AM  Result Value Ref Range   ABO/RH(D) A NEG    Antibody Screen POS    Sample Expiration 08/20/2015    Antibody Identification PASSIVELY ACQUIRED ANTI-D    DAT, IgG NEG    Unit Number [id number]    Blood Component Type RED CELLS,LR    Unit division 00    Status of Unit ISSUED    Transfusion Status OK TO TRANSFUSE    Crossmatch Result COMPATIBLE    Unit Number [id number]    Blood Component Type RED CELLS,LR    Unit division 00    Status of Unit ALLOCATED    Transfusion Status OK TO TRANSFUSE    Crossmatch Result COMPATIBLE    Unit Number [id number]    Blood Component Type RED CELLS,LR    Unit division 00    Status of Unit ISSUED    Transfusion Status OK TO TRANSFUSE    Crossmatch Result COMPATIBLE    Unit Number [id number]    Blood Component Type RED CELLS,LR    Unit division 00    Status of Unit ALLOCATED    Transfusion Status OK TO TRANSFUSE    Crossmatch Result COMPATIBLE   RPR     Status: None   Collection Time: 08/17/15  8:50 AM  Result Value Ref Range   RPR Ser Ql Non Reactive Non Reactive  Prepare RBC (crossmatch)     Status: None   Collection Time: 08/17/15  8:42 PM  Result Value Ref Range    Order Confirmation ORDER PROCESSED BY BLOOD BANK   CBC     Status: Abnormal   Collection Time: 08/17/15 10:10 PM  Result Value Ref Range   WBC 24.9 (H) 4.0 - 10.5 K/uL   RBC 2.82 (L) 3.87 - 5.11 MIL/uL   Hemoglobin 8.5 (L) 12.0 - 15.0 g/dL   HCT 66.0 (L) 63.0 - 16.0 %   MCV 90.1 78.0 - 100.0 fL   MCH 30.1 26.0 - 34.0 pg   MCHC 33.5 30.0 - 36.0 g/dL   RDW 10.9 32.3 - 55.7 %   Platelets 188 150 - 400 K/uL  CBC     Status: Abnormal   Collection Time: 08/18/15 12:00 AM  Result Value Ref Range   WBC 20.3 (H) 4.0 - 10.5 K/uL   RBC 2.56 (L) 3.87 - 5.11 MIL/uL   Hemoglobin 7.8 (L) 12.0 - 15.0 g/dL   HCT 32.2 (L) 02.5 -  46.0 %   MCV 88.7 78.0 - 100.0 fL   MCH 30.5 26.0 - 34.0 pg   MCHC 34.4 30.0 - 36.0 g/dL   RDW 51.8 84.1 - 66.0 %   Platelets 150 150 - 400 K/uL  Fibrinogen     Status: None   Collection Time: 08/18/15 12:00 AM  Result Value Ref Range   Fibrinogen 373 210 - 475 mg/dL   Abdomen is soft and non tender Packing removed No active bleeding 40 cc fluid removed from Bakri  Only 20 cc drainage overnight from Bakri  IMPRESSION: PPD #1 PPH Bakri balloon in place  PLAN: Status post transfusion 2 units Continue to slowly decompress balloon today Possibly remove Bakri tomorrow am Advance diet Epidural is still in

## 2015-08-18 NOTE — Anesthesia Postprocedure Evaluation (Signed)
Anesthesia Post Note  Patient: Lynn Zamora  Procedure(s) Performed: Procedure(s) (LRB): DILATATION AND CURETTAGE (N/A)  Patient location during evaluation: Mother Baby Anesthesia Type: Epidural Level of consciousness: awake Pain management: satisfactory to patient Vital Signs Assessment: post-procedure vital signs reviewed and stable Respiratory status: spontaneous breathing Cardiovascular status: stable Anesthetic complications: no     Last Vitals:  Vitals:   08/18/15 0711 08/18/15 0800  BP:  109/66  Pulse: 97 99  Resp:    Temp:      Last Pain:  Vitals:   08/18/15 0745  TempSrc:   PainSc: 7    Pain Goal: Patients Stated Pain Goal: 3 (08/18/15 0745)               Cephus Shelling

## 2015-08-19 LAB — CBC
HCT: 21.5 % — ABNORMAL LOW (ref 36.0–46.0)
Hemoglobin: 7.6 g/dL — ABNORMAL LOW (ref 12.0–15.0)
MCH: 30.8 pg (ref 26.0–34.0)
MCHC: 35.3 g/dL (ref 30.0–36.0)
MCV: 87 fL (ref 78.0–100.0)
PLATELETS: 124 10*3/uL — AB (ref 150–400)
RBC: 2.47 MIL/uL — AB (ref 3.87–5.11)
RDW: 16.2 % — ABNORMAL HIGH (ref 11.5–15.5)
WBC: 14.1 10*3/uL — AB (ref 4.0–10.5)

## 2015-08-19 MED ORDER — RHO D IMMUNE GLOBULIN 1500 UNIT/2ML IJ SOSY
300.0000 ug | PREFILLED_SYRINGE | Freq: Once | INTRAMUSCULAR | Status: AC
  Administered 2015-08-19: 300 ug via INTRAVENOUS
  Filled 2015-08-19: qty 2

## 2015-08-19 NOTE — Progress Notes (Signed)
UR chart review completed.  

## 2015-08-19 NOTE — Progress Notes (Signed)
Post Partum Day 2 Subjective: no complaints and tolerating PO.  Denies dizziness and CP/SOB.  Foley in and Bakri in place.  Objective: Blood pressure 108/60, pulse (!) 103, temperature 97.5 F (36.4 C), temperature source Oral, resp. rate 20, height 5\' 3"  (1.6 m), weight 186 lb (84.4 kg), SpO2 98 %, unknown if currently breastfeeding.  Physical Exam:  General: alert, cooperative and appears stated age 25: 62 cc from Bakri overnight, scant amount in bag this am Uterine Fundus: firm Incision: healing well, no significant drainage, no dehiscence DVT Evaluation: No evidence of DVT seen on physical exam. Negative Homan's sign. No cords or calf tenderness.  Bakri balloon reduced by 50cc.   Recent Labs  08/18/15 0840 08/19/15 0554  HGB 8.6* 7.6*  HCT 24.7* 21.5*    Assessment/Plan: Plan for discharge tomorrow and Breastfeeding  PPH- s/p 2 u PRBCs and iron infusion.  Bakri balloon in place with 50cc remaining.  Will drain remaining fluid this pm. Acute blood loss anemia-see above.  Tachycardia resolving.  Will check CBC tomorrow am.   LOS: 2 days   Pattie Flaharty 08/19/2015, 8:32 AM

## 2015-08-19 NOTE — Progress Notes (Signed)
Patient is feeling well.  No dizziness or SOB.  VSS.   Scant lochia on pad Bakri balloon removed.  No bleeding noted.  Will monitor in AICU for a few hours and if stable, transfer to floor bed.  Mitchel Honour, DO

## 2015-08-19 NOTE — Progress Notes (Signed)
Pt transferred to [facility]#[id number] via [facility] by Deberah Castle RN. All belongings taken with her.

## 2015-08-19 NOTE — Progress Notes (Signed)
Pt ambulated to bathroom approx 30 min after foley catheter removal. Pt tolerated well, but was a little weak, as was expected. Pt has voided 350cc of pale yellow urine. Ice pack and medicated spray provided. Sheryn Bison

## 2015-08-20 LAB — CBC
HCT: 21.6 % — ABNORMAL LOW (ref 36.0–46.0)
Hemoglobin: 7.4 g/dL — ABNORMAL LOW (ref 12.0–15.0)
MCH: 30.1 pg (ref 26.0–34.0)
MCHC: 34.3 g/dL (ref 30.0–36.0)
MCV: 87.8 fL (ref 78.0–100.0)
PLATELETS: 126 10*3/uL — AB (ref 150–400)
RBC: 2.46 MIL/uL — ABNORMAL LOW (ref 3.87–5.11)
RDW: 16 % — AB (ref 11.5–15.5)
WBC: 12.3 10*3/uL — ABNORMAL HIGH (ref 4.0–10.5)

## 2015-08-20 LAB — RH IG WORKUP (INCLUDES ABO/RH)
ABO/RH(D): A NEG
FETAL SCREEN: NEGATIVE
GESTATIONAL AGE(WKS): 39
Unit division: 0

## 2015-08-20 MED ORDER — OXYCODONE-ACETAMINOPHEN 5-325 MG PO TABS: 1.0000 | ORAL_TABLET | ORAL | 0 refills | Status: DC | PRN

## 2015-08-20 MED ORDER — DOCUSATE SODIUM 100 MG PO CAPS: 100.0000 mg | ORAL_CAPSULE | Freq: Two times a day (BID) | ORAL | 0 refills | Status: DC

## 2015-08-20 MED ORDER — FERROUS SULFATE 325 (65 FE) MG PO TABS: 325.0000 mg | ORAL_TABLET | Freq: Every day | ORAL | 3 refills | Status: DC

## 2015-08-20 MED ORDER — IBUPROFEN 600 MG PO TABS: 600.0000 mg | ORAL_TABLET | Freq: Four times a day (QID) | ORAL | 0 refills | Status: DC

## 2015-08-20 NOTE — Discharge Instructions (Signed)
Call MD for T>100.4, heavy vaginal bleeding, severe abdominal pain, or respiratory distress.  Call office to schedule hemoglobin check in 1 week.  No driving while taking narcotics.  Pelvic rest x 6 weeks.

## 2015-08-20 NOTE — Discharge Summary (Signed)
Obstetric Discharge Summary Reason for Admission: onset of labor Prenatal Procedures: none Intrapartum Procedures: spontaneous vaginal delivery Postpartum Procedures: transfusion 2u pRBCs, Feraheme, curettage and Bakri balloon placement Complications-Operative and Postpartum: 2nd degree perineal laceration and hemorrhage Hemoglobin  Date Value Ref Range Status  08/20/2015 7.4 (L) 12.0 - 15.0 g/dL Final   HCT  Date Value Ref Range Status  08/20/2015 21.6 (L) 36.0 - 46.0 % Final    Physical Exam:  General: alert, cooperative and appears stated age 25: appropriate Uterine Fundus: firm Incision: healing well, no significant drainage, no dehiscence DVT Evaluation: No evidence of DVT seen on physical exam. Negative Homan's sign. No cords or calf tenderness.  Discharge Diagnoses: Term Pregnancy-delivered  Discharge Information: Date: 08/20/2015 Activity: pelvic rest Diet: routine Medications: PNV, Ibuprofen, Colace, Iron and Percocet Condition: stable Instructions: refer to practice specific booklet Discharge to: home   Newborn Data: Live born female  Birth Weight: 7 lb 10.9 oz (3485 g) APGAR: 6, 9  Home with mother.  Lynn Zamora 08/20/2015, 7:47 AM

## 2015-08-20 NOTE — Progress Notes (Signed)
Post Partum Day 3 Subjective: no complaints, up ad lib, voiding, tolerating PO and + flatus.  Patient ambulating without light-headedness/dizziness.  Scant lochia.  Pain well-controlled  Objective: Blood pressure 123/72, pulse 96, temperature 97.6 F (36.4 C), temperature source Oral, resp. rate 18, height 5\' 3"  (1.6 m), weight 186 lb (84.4 kg), SpO2 98 %, unknown if currently breastfeeding.  Physical Exam:  General: alert, cooperative and appears stated age Lochia: appropriate Uterine Fundus: firm Incision: healing well, no significant drainage, no dehiscence DVT Evaluation: No evidence of DVT seen on physical exam. Negative Homan's sign. No cords or calf tenderness.   Recent Labs  08/19/15 0554 08/20/15 0601  HGB 7.6* 7.4*  HCT 21.5* 21.6*    Assessment/Plan: Discharge home and Breastfeeding  PPH/ABL anemia-continue FeSO4   LOS: 3 days   Lynn Zamora 08/20/2015, 7:41 AM

## 2015-08-21 LAB — TYPE AND SCREEN
ABO/RH(D): A NEG
Antibody Screen: POSITIVE
DAT, IgG: NEGATIVE
UNIT DIVISION: 0
Unit division: 0
Unit division: 0
Unit division: 0

## 2015-08-24 ENCOUNTER — Encounter (HOSPITAL_COMMUNITY): Payer: Self-pay | Admitting: Obstetrics and Gynecology

## 2015-08-24 NOTE — Anesthesia Preprocedure Evaluation (Addendum)
Anesthesia Evaluation  Patient identified by MRN, date of birth, ID band Patient awake    Reviewed: Allergy & Precautions, NPO status , Patient's Chart, lab work & pertinent test results, Unable to perform ROS - Chart review onlyPreop documentation limited or incomplete due to emergent nature of procedure.  Airway        Dental   Pulmonary           Cardiovascular      Neuro/Psych    GI/Hepatic   Endo/Other    Renal/GU      Musculoskeletal   Abdominal   Peds  Hematology   Anesthesia Other Findings   Reproductive/Obstetrics                            Anesthesia Physical Anesthesia Plan  ASA: II and emergent  Anesthesia Plan: Epidural   Post-op Pain Management:    Induction:   Airway Management Planned:   Additional Equipment:   Intra-op Plan:   Post-operative Plan:   Informed Consent:   Plan Discussed with: Anesthesiologist, CRNA and Surgeon  Anesthesia Plan Comments:         Anesthesia Quick Evaluation

## 2015-08-24 NOTE — Addendum Note (Signed)
Addendum  created 08/24/15 1707 by Sheldon Silvanavid Zahi Plaskett, MD   Anesthesia Review and Sign - Ready for Procedure, Sign clinical note

## 2015-08-24 NOTE — Anesthesia Postprocedure Evaluation (Signed)
Anesthesia Post Note  Patient: Lynn CraverHaley Zamora  Procedure(s) Performed: Procedure(s) (LRB): DILATATION AND CURETTAGE (N/A)  Patient location during evaluation: ICU Anesthesia Type: Epidural Level of consciousness: awake and alert Pain management: pain level controlled Vital Signs Assessment: post-procedure vital signs reviewed and stable Respiratory status: spontaneous breathing, nonlabored ventilation and respiratory function stable Cardiovascular status: stable Postop Assessment: no headache, no backache and epidural receding Anesthetic complications: no    Last Vitals:  Vitals:   08/20/15 0607 08/20/15 0900  BP: 123/72 128/79  Pulse: 96 99  Resp: 18 18  Temp: 36.4 C 36.6 C    Last Pain:  Vitals:   08/20/15 0900  TempSrc: Oral  PainSc:                  [name]

## 2015-08-25 DIAGNOSIS — D649 Anemia, unspecified: Secondary | ICD-10-CM | POA: Diagnosis not present

## 2015-09-08 DIAGNOSIS — D649 Anemia, unspecified: Secondary | ICD-10-CM | POA: Diagnosis not present

## 2015-09-26 DIAGNOSIS — Z1389 Encounter for screening for other disorder: Secondary | ICD-10-CM | POA: Diagnosis not present

## 2015-10-03 DIAGNOSIS — H6123 Impacted cerumen, bilateral: Secondary | ICD-10-CM | POA: Diagnosis not present

## 2015-10-03 DIAGNOSIS — H902 Conductive hearing loss, unspecified: Secondary | ICD-10-CM | POA: Diagnosis not present

## 2016-05-23 DIAGNOSIS — N911 Secondary amenorrhea: Secondary | ICD-10-CM | POA: Diagnosis not present

## 2016-05-28 DIAGNOSIS — Z3481 Encounter for supervision of other normal pregnancy, first trimester: Secondary | ICD-10-CM | POA: Diagnosis not present

## 2016-05-28 DIAGNOSIS — Z13228 Encounter for screening for other metabolic disorders: Secondary | ICD-10-CM | POA: Diagnosis not present

## 2016-05-28 DIAGNOSIS — Z3685 Encounter for antenatal screening for Streptococcus B: Secondary | ICD-10-CM | POA: Diagnosis not present

## 2016-05-28 LAB — OB RESULTS CONSOLE HIV ANTIBODY (ROUTINE TESTING): HIV: NONREACTIVE

## 2016-05-28 LAB — OB RESULTS CONSOLE ABO/RH: RH Type: NEGATIVE

## 2016-05-28 LAB — OB RESULTS CONSOLE RUBELLA ANTIBODY, IGM: Rubella: IMMUNE

## 2016-05-28 LAB — OB RESULTS CONSOLE RPR: RPR: NONREACTIVE

## 2016-05-28 LAB — OB RESULTS CONSOLE ANTIBODY SCREEN: Antibody Screen: NEGATIVE

## 2016-05-28 LAB — OB RESULTS CONSOLE GC/CHLAMYDIA
CHLAMYDIA, DNA PROBE: NEGATIVE
GC PROBE AMP, GENITAL: NEGATIVE

## 2016-05-28 LAB — OB RESULTS CONSOLE HEPATITIS B SURFACE ANTIGEN: Hepatitis B Surface Ag: NEGATIVE

## 2016-06-06 DIAGNOSIS — Z3A10 10 weeks gestation of pregnancy: Secondary | ICD-10-CM | POA: Diagnosis not present

## 2016-06-06 DIAGNOSIS — O30041 Twin pregnancy, dichorionic/diamniotic, first trimester: Secondary | ICD-10-CM | POA: Diagnosis not present

## 2016-06-06 DIAGNOSIS — Z113 Encounter for screening for infections with a predominantly sexual mode of transmission: Secondary | ICD-10-CM | POA: Diagnosis not present

## 2016-06-06 DIAGNOSIS — O4691 Antepartum hemorrhage, unspecified, first trimester: Secondary | ICD-10-CM | POA: Diagnosis not present

## 2016-06-25 DIAGNOSIS — Z3491 Encounter for supervision of normal pregnancy, unspecified, first trimester: Secondary | ICD-10-CM | POA: Diagnosis not present

## 2016-06-25 DIAGNOSIS — Z36 Encounter for antenatal screening for chromosomal anomalies: Secondary | ICD-10-CM | POA: Diagnosis not present

## 2016-06-25 DIAGNOSIS — Z3683 Encounter for fetal screening for congenital cardiac abnormalities: Secondary | ICD-10-CM | POA: Diagnosis not present

## 2016-07-10 DIAGNOSIS — Z3480 Encounter for supervision of other normal pregnancy, unspecified trimester: Secondary | ICD-10-CM | POA: Diagnosis not present

## 2016-08-03 DIAGNOSIS — Z3A19 19 weeks gestation of pregnancy: Secondary | ICD-10-CM | POA: Diagnosis not present

## 2016-08-03 DIAGNOSIS — O30042 Twin pregnancy, dichorionic/diamniotic, second trimester: Secondary | ICD-10-CM | POA: Diagnosis not present

## 2016-08-07 DIAGNOSIS — J0100 Acute maxillary sinusitis, unspecified: Secondary | ICD-10-CM | POA: Diagnosis not present

## 2016-08-07 DIAGNOSIS — R05 Cough: Secondary | ICD-10-CM | POA: Diagnosis not present

## 2016-09-07 DIAGNOSIS — O30042 Twin pregnancy, dichorionic/diamniotic, second trimester: Secondary | ICD-10-CM | POA: Diagnosis not present

## 2016-09-07 DIAGNOSIS — Z3A24 24 weeks gestation of pregnancy: Secondary | ICD-10-CM | POA: Diagnosis not present

## 2016-10-03 ENCOUNTER — Inpatient Hospital Stay (HOSPITAL_COMMUNITY): Payer: 59

## 2016-10-03 ENCOUNTER — Encounter (HOSPITAL_COMMUNITY): Payer: Self-pay | Admitting: *Deleted

## 2016-10-03 ENCOUNTER — Inpatient Hospital Stay (HOSPITAL_COMMUNITY)
Admission: AD | Admit: 2016-10-03 | Discharge: 2016-10-03 | Disposition: A | Discharge status: Home / Self Care | Payer: 59 | Source: Ambulatory Visit | Attending: Obstetrics and Gynecology | Admitting: Obstetrics and Gynecology

## 2016-10-03 DIAGNOSIS — W19XXXA Unspecified fall, initial encounter: Secondary | ICD-10-CM | POA: Insufficient documentation

## 2016-10-03 DIAGNOSIS — Z3A27 27 weeks gestation of pregnancy: Secondary | ICD-10-CM | POA: Diagnosis not present

## 2016-10-03 DIAGNOSIS — O4702 False labor before 37 completed weeks of gestation, second trimester: Secondary | ICD-10-CM | POA: Diagnosis not present

## 2016-10-03 DIAGNOSIS — O30002 Twin pregnancy, unspecified number of placenta and unspecified number of amniotic sacs, second trimester: Secondary | ICD-10-CM | POA: Diagnosis not present

## 2016-10-03 DIAGNOSIS — Z79899 Other long term (current) drug therapy: Secondary | ICD-10-CM | POA: Insufficient documentation

## 2016-10-03 DIAGNOSIS — Y92009 Unspecified place in unspecified non-institutional (private) residence as the place of occurrence of the external cause: Secondary | ICD-10-CM

## 2016-10-03 DIAGNOSIS — O30042 Twin pregnancy, dichorionic/diamniotic, second trimester: Secondary | ICD-10-CM | POA: Diagnosis not present

## 2016-10-03 NOTE — MAU Note (Addendum)
Larey Seat going down the steps last night, landed on her bottom.  Rt knee was hurting last night.  Denies any pain today. No bleeding or leaking.  States baby is moving

## 2016-10-03 NOTE — Discharge Instructions (Signed)
Braxton Hicks Contractions °Contractions of the uterus can occur throughout pregnancy, but they are not always a sign that you are in labor. You may have practice contractions called Braxton Hicks contractions. These false labor contractions are sometimes confused with true labor. °What are Braxton Hicks contractions? °Braxton Hicks contractions are tightening movements that occur in the muscles of the uterus before labor. Unlike true labor contractions, these contractions do not result in opening (dilation) and thinning of the cervix. Toward the end of pregnancy (32-34 weeks), Braxton Hicks contractions can happen more often and may become stronger. These contractions are sometimes difficult to tell apart from true labor because they can be very uncomfortable. You should not feel embarrassed if you go to the hospital with false labor. °Sometimes, the only way to tell if you are in true labor is for your health care provider to look for changes in the cervix. The health care provider will do a physical exam and may monitor your contractions. If you are not in true labor, the exam should show that your cervix is not dilating and your water has not broken. °If there are no prenatal problems or other health problems associated with your pregnancy, it is completely safe for you to be sent home with false labor. You may continue to have Braxton Hicks contractions until you go into true labor. °How can I tell the difference between true labor and false labor? °· Differences °? False labor °? Contractions last 30-70 seconds.: Contractions are usually shorter and not as strong as true labor contractions. °? Contractions become very regular.: Contractions are usually irregular. °? Discomfort is usually felt in the top of the uterus, and it spreads to the lower abdomen and low back.: Contractions are often felt in the front of the lower abdomen and in the groin. °? Contractions do not go away with walking.: Contractions may  go away when you walk around or change positions while lying down. °? Contractions usually become more intense and increase in frequency.: Contractions get weaker and are shorter-lasting as time goes on. °? The cervix dilates and gets thinner.: The cervix usually does not dilate or become thin. °Follow these instructions at home: °· Take over-the-counter and prescription medicines only as told by your health care provider. °· Keep up with your usual exercises and follow other instructions from your health care provider. °· Eat and drink lightly if you think you are going into labor. °· If Braxton Hicks contractions are making you uncomfortable: °? Change your position from lying down or resting to walking, or change from walking to resting. °? Sit and rest in a tub of warm water. °? Drink enough fluid to keep your urine clear or pale yellow. Dehydration may cause these contractions. °? Do slow and deep breathing several times an hour. °· Keep all follow-up prenatal visits as told by your health care provider. This is important. °Contact a health care provider if: °· You have a fever. °· You have continuous pain in your abdomen. °Get help right away if: °· Your contractions become stronger, more regular, and closer together. °· You have fluid leaking or gushing from your vagina. °· You pass blood-tinged mucus (bloody show). °· You have bleeding from your vagina. °· You have low back pain that you never had before. °· You feel your baby’s head pushing down and causing pelvic pressure. °· Your baby is not moving inside you as much as it used to. °Summary °· Contractions that occur before labor are   called Braxton Hicks contractions, false labor, or practice contractions.  Braxton Hicks contractions are usually shorter, weaker, farther apart, and less regular than true labor contractions. True labor contractions usually become progressively stronger and regular and they become more frequent.  Manage discomfort from  [facility] contractions by changing position, resting in a warm bath, drinking plenty of water, or practicing deep breathing. This information is not intended to replace advice given to you by your health care provider. Make sure you discuss any questions you have with your health care provider. Document Released: 01/01/2005 Document Revised: 11/21/2015 Document Reviewed: 11/21/2015 Elsevier Interactive Patient Education  2017 [employer].  Multiple Pregnancy Having a multiple pregnancy means that a woman is carrying more than one baby at a time. She may be pregnant with twins, triplets, or more. The majority of multiple pregnancies are twins. Naturally conceiving triplets or more (higher-order multiples) is rare. Multiple pregnancies are riskier than single pregnancies. A woman with a multiple pregnancy is more likely to have certain problems during her pregnancy. Therefore, she will need to have more frequent appointments for prenatal care. How does a multiple pregnancy happen? A multiple pregnancy happens when:  The woman's body releases more than one egg at a time, and then each egg gets fertilized by a different sperm. ? This is the most common type of multiple pregnancy. ? Twins or other multiples produced this way are fraternal. They are no more alike than non-multiple siblings are.  One sperm fertilizes one egg, which then divides into more than one embryo. ? Twins or other multiples produced this way are identical. Identical multiples are always the same gender, and they look very much alike.  Who is most likely to have a multiple pregnancy? A multiple pregnancy is more likely to develop in women who:  Have had fertility treatment, especially if the treatment included fertility drugs.  Are older than 25 years of age.  Have already had four or more children.  Have a family history of multiple pregnancy.  How is a multiple pregnancy diagnosed? A multiple pregnancy may be  diagnosed based on:  Symptoms such as: ? Rapid weight gain in the first 3 months of pregnancy (first trimester). ? More severe nausea and breast tenderness than what is typical of a single pregnancy. ? The uterus measuring larger than what is normal for the stage of the pregnancy.  Blood tests that detect a higher-than-normal level of human chorionic gonadotropin (hCG). This is a hormone that your body produces in early pregnancy.  Ultrasound exam. This is used to confirm that you are carrying multiples.  What risks are associated with multiple pregnancy? A multiple pregnancy puts you at a higher risk for certain problems during or after your pregnancy, including:  Having your babies delivered before you have reached a full-term pregnancy (preterm birth). A full-term pregnancy lasts for at least 37 weeks. Babies born before 37 weeks may have a higher risk of a variety of health problems, such as breathing problems, feeding difficulties, cerebral palsy, and learning disabilities.  Diabetes.  Preeclampsia. This is a serious condition that causes high blood pressure along with other symptoms, such as swelling and headaches, during pregnancy.  Excessive blood loss after childbirth (postpartum hemorrhage).  Postpartum depression.  Low birth weight of the babies.  How will having a multiple pregnancy affect my care? Your health care provider will want to monitor you more closely during your pregnancy to make sure that your babies are growing normally and that  you are healthy. Follow these instructions at home: Because your pregnancy is considered to be high risk, you will need to work closely with your health care team. You may also need to make some lifestyle changes. These may include the following: Eating and drinking  Increase your nutrition. ? Follow your health care providers recommendations for weight gain. You may need to gain a little extra weight when you are pregnant with  multiples. ? Eat healthy snacks often throughout the day. This can add calories and reduce nausea.  Drink enough fluid to keep your urine clear or pale yellow.  Take prenatal vitamins. Activity By 20-24 weeks, you may need to limit your activities.  Avoid activities and work that take a lot of effort (are strenuous).  Ask your health care provider when you should stop having sexual intercourse.  Rest often.  General instructions  Do not use any products that contain nicotine or tobacco, such as cigarettes and e-cigarettes. If you need help quitting, ask your health care provider.  Do not drink alcohol or use illegal drugs.  Take over-the-counter and prescription medicines only as told by your health care provider.  Arrange for extra help around the house.  Keep all follow-up visits and all prenatal visits as told by your health care provider. This is important. Contact a health care provider if:  You have dizziness.  You have persistent nausea, vomiting, or diarrhea.  You are having trouble gaining weight.  You have feelings of depression or other emotions that are interfering with your normal activities. Get help right away if:  You have a fever.  You have pain with urination.  You have fluid leaking from your vagina.  You have a bad-smelling vaginal discharge.  You notice increased swelling in your face, hands, legs, or ankles.  You have spotting or bleeding from your vagina.  You have pelvic cramps, pelvic pressure, or nagging pain in your abdomen or lower back.  You are having regular contractions.  You develop a severe headache, with or without visual changes.  You have shortness of breath or chest pain.  You notice less fetal movement, or no fetal movement. This information is not intended to replace advice given to you by your health care provider. Make sure you discuss any questions you have with your health care provider. Document Released:  10/11/2007 Document Revised: 09/02/2015 Document Reviewed: 09/02/2015 Elsevier Interactive Patient Education  [employer].

## 2016-10-03 NOTE — MAU Provider Note (Signed)
History     CSN: [id number]  Arrival date and time: 10/03/16 1440   None     Chief Complaint  Patient presents with  . Fall   HPI   Ms.Lynn Zamora is a 26 y.o. female G2P1001 @ [redacted]w[redacted]d with twins here for monitoring after a fall. Last night around 8 pm she slipped and fell on her buttocks.  She did not hit her abdomen during the fall.   + fetal movement Denies vaginal bleeding or leaking of water. Denies abdominal pain.   OB History    Gravida Para Term Preterm AB Living   SAB TAB Ectopic Multiple Live Births         0 1      History reviewed. No pertinent past medical history.  Past Surgical History:  Procedure Laterality Date  . DILATION AND CURETTAGE OF UTERUS N/A 08/17/2015   Procedure: DILATATION AND CURETTAGE;  Surgeon: Zelphia Cairo, MD;  Location: [facility];  Service: Gynecology;  Laterality: N/A;  . HERNIA REPAIR    . WISDOM TOOTH EXTRACTION      History reviewed. No pertinent family history.  Social History  Substance Use Topics  . Smoking status: Never Smoker  . Smokeless tobacco: Never Used  . Alcohol use No    Allergies: No Known Allergies  Prescriptions Prior to Admission  Medication Sig Dispense Refill Last Dose  . Prenatal Vit-Fe Fumarate-FA (PRENATAL MULTIVITAMIN) TABS tablet Take 1 tablet by mouth daily at 12 noon.   10/02/2016 at Unknown time  . RaNITidine HCl (ZANTAC PO) Take 1 tablet by mouth daily.    10/02/2016 at Unknown time   No results found for this or any previous visit (from the past 48 hour(s)).   No results found.   Review of Systems  Gastrointestinal: Negative for abdominal pain.  Genitourinary: Negative for dysuria and vaginal bleeding.   Physical Exam   Blood pressure 116/80, pulse (!) 123, temperature 98.3 F (36.8 C), temperature source Oral, resp. rate 16, unknown if currently breastfeeding.  Physical Exam  Constitutional: She is oriented to person, place, and time. She appears well-developed and  well-nourished. No distress.  HENT:  Head: Normocephalic.  Eyes: Pupils are equal, round, and reactive to light.  GI: Soft. She exhibits no distension. There is no tenderness. There is no rebound.  Genitourinary:  Genitourinary Comments: Dilation: Closed Exam by:: Shela Commons Rasch NP  Musculoskeletal: Normal range of motion.  Neurological: She is alert and oriented to person, place, and time.  Skin: Skin is warm. She is not diaphoretic.  Psychiatric: Her behavior is normal.   Fetal Tracing Fetus A Baseline: 135 bpm Variability: Moderate  Accelerations: 10x10 Decelerations: none Toco: Occasional with UI Fetal Tracing Fetus B Baseline: 125 bpm Variability: moderate  Accelerations: 15x15 Decelerations: none  MAU Course  Procedures  None  MDM  4 hours of fetal monitoring per Dr. Marcelle Overlie. Occasional contraction noted on TOCA; [place] ordered for cervical length.  CL >4 cm.  Discussed [place] and tracing with Dr. Marcelle Overlie. Ok for DC home.   Assessment and Plan   A:  1. Fall in home, initial encounter   2. [redacted] weeks gestation of pregnancy   3. Twin gestation in second trimester   4. Preterm uterine contractions in second trimester, antepartum     P:  Discharge home in stable condition Keep your appointment with Dr. Marcelle Overlie on Friday  Return to MAU if symptoms worsen  Preterm labor  precautions  Fall precautions  Rasch, Harolyn Rutherford, NP 10/03/2016 8:32 PM

## 2016-10-03 NOTE — MAU Note (Signed)
Urine in lab 

## 2016-10-05 DIAGNOSIS — Z3A28 28 weeks gestation of pregnancy: Secondary | ICD-10-CM | POA: Diagnosis not present

## 2016-10-05 DIAGNOSIS — O30043 Twin pregnancy, dichorionic/diamniotic, third trimester: Secondary | ICD-10-CM | POA: Diagnosis not present

## 2016-10-05 DIAGNOSIS — Z23 Encounter for immunization: Secondary | ICD-10-CM | POA: Diagnosis not present

## 2016-10-05 DIAGNOSIS — O30042 Twin pregnancy, dichorionic/diamniotic, second trimester: Secondary | ICD-10-CM | POA: Diagnosis not present

## 2016-10-17 DIAGNOSIS — Z3400 Encounter for supervision of normal first pregnancy, unspecified trimester: Secondary | ICD-10-CM | POA: Diagnosis not present

## 2016-10-17 DIAGNOSIS — Z3A29 29 weeks gestation of pregnancy: Secondary | ICD-10-CM | POA: Diagnosis not present

## 2016-10-17 DIAGNOSIS — Z3480 Encounter for supervision of other normal pregnancy, unspecified trimester: Secondary | ICD-10-CM | POA: Diagnosis not present

## 2016-10-17 DIAGNOSIS — O360930 Maternal care for other rhesus isoimmunization, third trimester, not applicable or unspecified: Secondary | ICD-10-CM | POA: Diagnosis not present

## 2016-10-30 DIAGNOSIS — O30043 Twin pregnancy, dichorionic/diamniotic, third trimester: Secondary | ICD-10-CM | POA: Diagnosis not present

## 2016-10-30 DIAGNOSIS — Z3A31 31 weeks gestation of pregnancy: Secondary | ICD-10-CM | POA: Diagnosis not present

## 2016-11-09 ENCOUNTER — Ambulatory Visit (INDEPENDENT_AMBULATORY_CARE_PROVIDER_SITE_OTHER): Payer: 59 | Admitting: *Deleted

## 2016-11-09 VITALS — BP 113/73 | HR 110

## 2016-11-09 DIAGNOSIS — O30043 Twin pregnancy, dichorionic/diamniotic, third trimester: Secondary | ICD-10-CM

## 2016-11-09 DIAGNOSIS — O30049 Twin pregnancy, dichorionic/diamniotic, unspecified trimester: Secondary | ICD-10-CM

## 2016-11-09 NOTE — Progress Notes (Signed)
Copy of report and NST tracing sent to Dr. Lowe w/pt today.  

## 2016-11-16 ENCOUNTER — Ambulatory Visit (INDEPENDENT_AMBULATORY_CARE_PROVIDER_SITE_OTHER): Payer: 59 | Admitting: *Deleted

## 2016-11-16 VITALS — BP 112/73 | HR 103

## 2016-11-16 DIAGNOSIS — O30049 Twin pregnancy, dichorionic/diamniotic, unspecified trimester: Secondary | ICD-10-CM

## 2016-11-16 DIAGNOSIS — O30043 Twin pregnancy, dichorionic/diamniotic, third trimester: Secondary | ICD-10-CM

## 2016-11-16 NOTE — Progress Notes (Signed)
Copy of report and NST tracing sent to Dr. Leger w/pt today.  

## 2016-11-23 ENCOUNTER — Ambulatory Visit: Payer: 59 | Admitting: *Deleted

## 2016-11-23 VITALS — BP 112/71 | HR 119

## 2016-11-23 DIAGNOSIS — Z3480 Encounter for supervision of other normal pregnancy, unspecified trimester: Secondary | ICD-10-CM | POA: Diagnosis not present

## 2016-11-23 DIAGNOSIS — O30043 Twin pregnancy, dichorionic/diamniotic, third trimester: Secondary | ICD-10-CM

## 2016-11-23 DIAGNOSIS — Z3400 Encounter for supervision of normal first pregnancy, unspecified trimester: Secondary | ICD-10-CM | POA: Diagnosis not present

## 2016-11-23 DIAGNOSIS — Z3A35 35 weeks gestation of pregnancy: Secondary | ICD-10-CM | POA: Diagnosis not present

## 2016-11-23 DIAGNOSIS — O30049 Twin pregnancy, dichorionic/diamniotic, unspecified trimester: Secondary | ICD-10-CM

## 2016-11-23 NOTE — Progress Notes (Signed)
Copy of report and NST tracing sent to Dr. Adkins w/pt today 

## 2016-11-30 ENCOUNTER — Ambulatory Visit (INDEPENDENT_AMBULATORY_CARE_PROVIDER_SITE_OTHER): Payer: 59 | Admitting: General Practice

## 2016-11-30 ENCOUNTER — Encounter (HOSPITAL_COMMUNITY): Payer: Self-pay

## 2016-11-30 VITALS — BP 117/74 | HR 104

## 2016-11-30 DIAGNOSIS — O30043 Twin pregnancy, dichorionic/diamniotic, third trimester: Secondary | ICD-10-CM

## 2016-11-30 NOTE — Progress Notes (Signed)
Copy of report & NST tracing sent to Dr Renaldo FiddlerAdkins with patient today

## 2016-12-03 NOTE — H&P (Signed)
Lynn CraverHaley Hoey is a 26 y.o. female presenting for primary c-section @ 38 wks.  Uncomplicated twin pregnancy.  OB History    Gravida Para Term Preterm AB Living   2 1 1     1    SAB TAB Ectopic Multiple Live Births         0 1     No past medical history on file. Past Surgical History:  Procedure Laterality Date  . DILATATION AND CURETTAGE N/A 08/17/2015   Performed by Zelphia CairoAdkins, Kassadi Presswood, [facility]  . HERNIA REPAIR    . WISDOM TOOTH EXTRACTION     Family History: family history includes Heart disease in her maternal grandfather; Hypertension in her mother; Lung cancer in her paternal grandfather. Social History:  reports that  has never smoked. she has never used smokeless tobacco. She reports that she does not drink alcohol or use drugs.     Maternal Diabetes: No Genetic Screening: Declined Maternal Ultrasounds/Referrals: Normal Fetal Ultrasounds or other Referrals:  None Maternal Substance Abuse:  No Significant Maternal Medications:  None Significant Maternal Lab Results:  None Other Comments:  None  ROS History   Exam Physical Exam  Gen - NAD CV - RRR Lungs - clear Abd - gravid, NT Ext - NT Prenatal labs: ABO, Rh: A/Negative/-- (05/14 0000) Antibody: Negative (05/14 0000) Rubella: Immune (05/14 0000) RPR: Nonreactive (05/14 0000)  HBsAg: Negative (05/14 0000)  HIV: Non-reactive (05/14 0000)  GBS:     Assessment/Plan: Twins @ 38 wks Pt prefer c-section for delivery   Zelphia CairoGretchen Jerrine Urschel 12/03/2016, 4:51 PM

## 2016-12-04 ENCOUNTER — Ambulatory Visit (INDEPENDENT_AMBULATORY_CARE_PROVIDER_SITE_OTHER): Payer: 59 | Admitting: *Deleted

## 2016-12-04 VITALS — BP 115/78 | HR 119

## 2016-12-04 DIAGNOSIS — O30043 Twin pregnancy, dichorionic/diamniotic, third trimester: Secondary | ICD-10-CM

## 2016-12-04 NOTE — Progress Notes (Signed)
Copy of report and NST tracing sent to Dr. Morris w/pt today.  

## 2016-12-06 ENCOUNTER — Encounter (HOSPITAL_COMMUNITY): Payer: Self-pay | Admitting: *Deleted

## 2016-12-06 ENCOUNTER — Inpatient Hospital Stay (HOSPITAL_COMMUNITY)
Admission: AD | Admit: 2016-12-06 | Discharge: 2016-12-08 | DRG: 788 | Disposition: A | Discharge status: Home / Self Care | Payer: 59 | Source: Ambulatory Visit | Attending: Obstetrics & Gynecology | Admitting: Obstetrics & Gynecology

## 2016-12-06 ENCOUNTER — Inpatient Hospital Stay (HOSPITAL_COMMUNITY): Payer: 59 | Admitting: Certified Registered Nurse Anesthetist

## 2016-12-06 ENCOUNTER — Encounter (HOSPITAL_COMMUNITY)
Admission: AD | Disposition: A | Discharge status: Home / Self Care | Payer: Self-pay | Source: Ambulatory Visit | Attending: Obstetrics & Gynecology

## 2016-12-06 DIAGNOSIS — Z6791 Unspecified blood type, Rh negative: Secondary | ICD-10-CM

## 2016-12-06 DIAGNOSIS — O99824 Streptococcus B carrier state complicating childbirth: Secondary | ICD-10-CM | POA: Diagnosis present

## 2016-12-06 DIAGNOSIS — O26893 Other specified pregnancy related conditions, third trimester: Secondary | ICD-10-CM | POA: Diagnosis present

## 2016-12-06 DIAGNOSIS — Z3A37 37 weeks gestation of pregnancy: Secondary | ICD-10-CM | POA: Diagnosis not present

## 2016-12-06 DIAGNOSIS — O43813 Placental infarction, third trimester: Secondary | ICD-10-CM | POA: Diagnosis not present

## 2016-12-06 DIAGNOSIS — O30049 Twin pregnancy, dichorionic/diamniotic, unspecified trimester: Secondary | ICD-10-CM | POA: Diagnosis not present

## 2016-12-06 DIAGNOSIS — Z98891 History of uterine scar from previous surgery: Secondary | ICD-10-CM

## 2016-12-06 DIAGNOSIS — Z3A00 Weeks of gestation of pregnancy not specified: Secondary | ICD-10-CM | POA: Diagnosis not present

## 2016-12-06 DIAGNOSIS — O30043 Twin pregnancy, dichorionic/diamniotic, third trimester: Principal | ICD-10-CM | POA: Diagnosis present

## 2016-12-06 DIAGNOSIS — O30009 Twin pregnancy, unspecified number of placenta and unspecified number of amniotic sacs, unspecified trimester: Secondary | ICD-10-CM | POA: Diagnosis not present

## 2016-12-06 LAB — CBC
HEMATOCRIT: 36.3 % (ref 36.0–46.0)
HEMOGLOBIN: 12 g/dL (ref 12.0–15.0)
MCH: 31.1 pg (ref 26.0–34.0)
MCHC: 33.1 g/dL (ref 30.0–36.0)
MCV: 94 fL (ref 78.0–100.0)
Platelets: 151 10*3/uL (ref 150–400)
RBC: 3.86 MIL/uL — AB (ref 3.87–5.11)
RDW: 13.8 % (ref 11.5–15.5)
WBC: 19.6 10*3/uL — ABNORMAL HIGH (ref 4.0–10.5)

## 2016-12-06 SURGERY — Surgical Case
Anesthesia: Spinal

## 2016-12-06 MED ORDER — LACTATED RINGERS IV BOLUS (SEPSIS)
1000.0000 mL | Freq: Once | INTRAVENOUS | Status: AC
  Administered 2016-12-06: 1000 mL via INTRAVENOUS

## 2016-12-06 MED ORDER — SIMETHICONE 80 MG PO CHEW
80.0000 mg | CHEWABLE_TABLET | ORAL | Status: DC
  Administered 2016-12-06 – 2016-12-08 (×2): 80 mg via ORAL
  Filled 2016-12-06 (×2): qty 1

## 2016-12-06 MED ORDER — FENTANYL CITRATE (PF) 100 MCG/2ML IJ SOLN
INTRAMUSCULAR | Status: AC
  Filled 2016-12-06: qty 2

## 2016-12-06 MED ORDER — NALBUPHINE HCL 10 MG/ML IJ SOLN: 5.0000 mg | Freq: Once | INTRAMUSCULAR | Status: DC | PRN

## 2016-12-06 MED ORDER — ONDANSETRON HCL 4 MG/2ML IJ SOLN
INTRAMUSCULAR | Status: DC | PRN
  Administered 2016-12-06: 4 mg via INTRAVENOUS

## 2016-12-06 MED ORDER — DIPHENHYDRAMINE HCL 25 MG PO CAPS: 25.0000 mg | ORAL_CAPSULE | Freq: Four times a day (QID) | ORAL | Status: DC | PRN

## 2016-12-06 MED ORDER — ACETAMINOPHEN 325 MG PO TABS
650.0000 mg | ORAL_TABLET | ORAL | Status: DC | PRN
  Administered 2016-12-08: 650 mg via ORAL
  Filled 2016-12-06: qty 2

## 2016-12-06 MED ORDER — LACTATED RINGERS IV SOLN
INTRAVENOUS | Status: DC | PRN
  Administered 2016-12-06 (×2): via INTRAVENOUS

## 2016-12-06 MED ORDER — SIMETHICONE 80 MG PO CHEW
80.0000 mg | CHEWABLE_TABLET | Freq: Three times a day (TID) | ORAL | Status: DC
  Administered 2016-12-07 – 2016-12-08 (×4): 80 mg via ORAL
  Filled 2016-12-06 (×5): qty 1

## 2016-12-06 MED ORDER — FAMOTIDINE IN NACL 20-0.9 MG/50ML-% IV SOLN
20.0000 mg | Freq: Once | INTRAVENOUS | Status: AC
  Administered 2016-12-06: 20 mg via INTRAVENOUS
  Filled 2016-12-06: qty 50

## 2016-12-06 MED ORDER — OXYTOCIN 10 UNIT/ML IJ SOLN
INTRAMUSCULAR | Status: DC | PRN
  Administered 2016-12-06: 40 [IU] via INTRAVENOUS

## 2016-12-06 MED ORDER — SOD CITRATE-CITRIC ACID 500-334 MG/5ML PO SOLN
30.0000 mL | Freq: Once | ORAL | Status: AC
  Administered 2016-12-06: 30 mL via ORAL
  Filled 2016-12-06: qty 15

## 2016-12-06 MED ORDER — SCOPOLAMINE 1 MG/3DAYS TD PT72
MEDICATED_PATCH | TRANSDERMAL | Status: AC
  Filled 2016-12-06: qty 1

## 2016-12-06 MED ORDER — BUPIVACAINE IN DEXTROSE 0.75-8.25 % IT SOLN
INTRATHECAL | Status: DC | PRN
  Administered 2016-12-06: 1.4 mL via INTRATHECAL

## 2016-12-06 MED ORDER — IBUPROFEN 600 MG PO TABS
600.0000 mg | ORAL_TABLET | Freq: Four times a day (QID) | ORAL | Status: DC
  Administered 2016-12-06 – 2016-12-08 (×7): 600 mg via ORAL
  Filled 2016-12-06 (×7): qty 1

## 2016-12-06 MED ORDER — COCONUT OIL OIL: 1.0000 "application " | TOPICAL_OIL | Status: DC | PRN

## 2016-12-06 MED ORDER — NALOXONE HCL 0.4 MG/ML IJ SOLN: 0.4000 mg | INTRAMUSCULAR | Status: DC | PRN

## 2016-12-06 MED ORDER — ZOLPIDEM TARTRATE 5 MG PO TABS: 5.0000 mg | ORAL_TABLET | Freq: Every evening | ORAL | Status: DC | PRN

## 2016-12-06 MED ORDER — MEPERIDINE HCL 25 MG/ML IJ SOLN: 6.2500 mg | INTRAMUSCULAR | Status: DC | PRN

## 2016-12-06 MED ORDER — SCOPOLAMINE 1 MG/3DAYS TD PT72
MEDICATED_PATCH | TRANSDERMAL | Status: DC | PRN
  Administered 2016-12-06: 1 via TRANSDERMAL

## 2016-12-06 MED ORDER — PRENATAL MULTIVITAMIN CH
1.0000 | ORAL_TABLET | Freq: Every day | ORAL | Status: DC
  Administered 2016-12-07 – 2016-12-08 (×2): 1 via ORAL
  Filled 2016-12-06 (×2): qty 1

## 2016-12-06 MED ORDER — DEXAMETHASONE SODIUM PHOSPHATE 10 MG/ML IJ SOLN
INTRAMUSCULAR | Status: DC | PRN
  Administered 2016-12-06: 10 mg via INTRAVENOUS

## 2016-12-06 MED ORDER — CEFAZOLIN SODIUM-DEXTROSE 2-4 GM/100ML-% IV SOLN
2.0000 g | Freq: Once | INTRAVENOUS | Status: AC
  Administered 2016-12-06: 2 g via INTRAVENOUS
  Filled 2016-12-06: qty 100

## 2016-12-06 MED ORDER — OXYCODONE-ACETAMINOPHEN 5-325 MG PO TABS
1.0000 | ORAL_TABLET | ORAL | Status: DC | PRN
  Administered 2016-12-08 (×3): 1 via ORAL
  Filled 2016-12-06 (×4): qty 1

## 2016-12-06 MED ORDER — FENTANYL CITRATE (PF) 100 MCG/2ML IJ SOLN
INTRAMUSCULAR | Status: DC | PRN
  Administered 2016-12-06: 20 ug via INTRATHECAL

## 2016-12-06 MED ORDER — SODIUM CHLORIDE 0.9 % IV SOLN
INTRAVENOUS | Status: DC
  Administered 2016-12-06: 14:00:00 via INTRAVENOUS

## 2016-12-06 MED ORDER — KETOROLAC TROMETHAMINE 30 MG/ML IJ SOLN: 30.0000 mg | Freq: Once | INTRAMUSCULAR | Status: DC | PRN

## 2016-12-06 MED ORDER — ACETAMINOPHEN 500 MG PO TABS
1000.0000 mg | ORAL_TABLET | Freq: Four times a day (QID) | ORAL | Status: AC
  Administered 2016-12-06 – 2016-12-07 (×4): 1000 mg via ORAL
  Filled 2016-12-06 (×4): qty 2

## 2016-12-06 MED ORDER — OXYTOCIN 10 UNIT/ML IJ SOLN
INTRAMUSCULAR | Status: AC
  Filled 2016-12-06: qty 4

## 2016-12-06 MED ORDER — OXYCODONE-ACETAMINOPHEN 5-325 MG PO TABS: 2.0000 | ORAL_TABLET | ORAL | Status: DC | PRN

## 2016-12-06 MED ORDER — PHENYLEPHRINE 8 MG IN D5W 100 ML (0.08MG/ML) PREMIX OPTIME
INJECTION | INTRAVENOUS | Status: AC
  Filled 2016-12-06: qty 100

## 2016-12-06 MED ORDER — SIMETHICONE 80 MG PO CHEW
80.0000 mg | CHEWABLE_TABLET | ORAL | Status: DC | PRN
  Administered 2016-12-08: 80 mg via ORAL

## 2016-12-06 MED ORDER — SODIUM CHLORIDE 0.9 % IR SOLN
Status: DC | PRN
  Administered 2016-12-06: 1000 mL

## 2016-12-06 MED ORDER — WITCH HAZEL-GLYCERIN EX PADS: 1.0000 "application " | MEDICATED_PAD | CUTANEOUS | Status: DC | PRN

## 2016-12-06 MED ORDER — DEXTROSE 5 % IV SOLN
1.0000 ug/kg/h | INTRAVENOUS | Status: DC | PRN
  Filled 2016-12-06: qty 5

## 2016-12-06 MED ORDER — HYDROMORPHONE HCL 1 MG/ML IJ SOLN
0.2500 mg | INTRAMUSCULAR | Status: DC | PRN
  Administered 2016-12-06: 0.5 mg via INTRAVENOUS

## 2016-12-06 MED ORDER — LACTATED RINGERS IV SOLN
INTRAVENOUS | Status: DC
  Administered 2016-12-06: 21:00:00 via INTRAVENOUS

## 2016-12-06 MED ORDER — PROMETHAZINE HCL 25 MG/ML IJ SOLN: 6.2500 mg | INTRAMUSCULAR | Status: DC | PRN

## 2016-12-06 MED ORDER — NALBUPHINE HCL 10 MG/ML IJ SOLN: 5.0000 mg | INTRAMUSCULAR | Status: DC | PRN

## 2016-12-06 MED ORDER — DIBUCAINE 1 % RE OINT: 1.0000 "application " | TOPICAL_OINTMENT | RECTAL | Status: DC | PRN

## 2016-12-06 MED ORDER — MORPHINE SULFATE (PF) 0.5 MG/ML IJ SOLN
INTRAMUSCULAR | Status: DC | PRN
  Administered 2016-12-06: .2 mg via INTRATHECAL

## 2016-12-06 MED ORDER — ONDANSETRON HCL 4 MG/2ML IJ SOLN: 4.0000 mg | Freq: Three times a day (TID) | INTRAMUSCULAR | Status: DC | PRN

## 2016-12-06 MED ORDER — SODIUM CHLORIDE 0.9% FLUSH: 3.0000 mL | INTRAVENOUS | Status: DC | PRN

## 2016-12-06 MED ORDER — ONDANSETRON HCL 4 MG/2ML IJ SOLN
INTRAMUSCULAR | Status: AC
  Filled 2016-12-06: qty 2

## 2016-12-06 MED ORDER — NALBUPHINE HCL 10 MG/ML IJ SOLN
5.0000 mg | INTRAMUSCULAR | Status: DC | PRN
Start: 2016-12-06 — End: 2016-12-08

## 2016-12-06 MED ORDER — TETANUS-DIPHTH-ACELL PERTUSSIS 5-2.5-18.5 LF-MCG/0.5 IM SUSP: 0.5000 mL | Freq: Once | INTRAMUSCULAR | Status: DC

## 2016-12-06 MED ORDER — SENNOSIDES-DOCUSATE SODIUM 8.6-50 MG PO TABS
2.0000 | ORAL_TABLET | ORAL | Status: DC
  Administered 2016-12-06 – 2016-12-08 (×2): 2 via ORAL
  Filled 2016-12-06 (×2): qty 2

## 2016-12-06 MED ORDER — KETOROLAC TROMETHAMINE 30 MG/ML IJ SOLN: 30.0000 mg | Freq: Four times a day (QID) | INTRAMUSCULAR | Status: AC | PRN

## 2016-12-06 MED ORDER — MORPHINE SULFATE (PF) 0.5 MG/ML IJ SOLN
INTRAMUSCULAR | Status: AC
  Filled 2016-12-06: qty 10

## 2016-12-06 MED ORDER — LACTATED RINGERS IV SOLN
INTRAVENOUS | Status: DC | PRN
  Administered 2016-12-06: 16:00:00 via INTRAVENOUS

## 2016-12-06 MED ORDER — SCOPOLAMINE 1 MG/3DAYS TD PT72
1.0000 | MEDICATED_PATCH | Freq: Once | TRANSDERMAL | Status: DC
  Filled 2016-12-06: qty 1

## 2016-12-06 MED ORDER — DIPHENHYDRAMINE HCL 50 MG/ML IJ SOLN: 12.5000 mg | INTRAMUSCULAR | Status: DC | PRN

## 2016-12-06 MED ORDER — DEXAMETHASONE SODIUM PHOSPHATE 10 MG/ML IJ SOLN
INTRAMUSCULAR | Status: AC
  Filled 2016-12-06: qty 1

## 2016-12-06 MED ORDER — KETOROLAC TROMETHAMINE 30 MG/ML IJ SOLN
30.0000 mg | Freq: Four times a day (QID) | INTRAMUSCULAR | Status: AC | PRN
  Administered 2016-12-06: 30 mg via INTRAMUSCULAR

## 2016-12-06 MED ORDER — PHENYLEPHRINE 8 MG IN D5W 100 ML (0.08MG/ML) PREMIX OPTIME
INJECTION | INTRAVENOUS | Status: DC | PRN
Start: 2016-12-06 — End: 2016-12-06
  Administered 2016-12-06: 60 ug/min via INTRAVENOUS

## 2016-12-06 MED ORDER — KETOROLAC TROMETHAMINE 30 MG/ML IJ SOLN
INTRAMUSCULAR | Status: AC
  Filled 2016-12-06: qty 1

## 2016-12-06 MED ORDER — HYDROMORPHONE HCL 1 MG/ML IJ SOLN
INTRAMUSCULAR | Status: AC
  Filled 2016-12-06: qty 0.5

## 2016-12-06 MED ORDER — MENTHOL 3 MG MT LOZG: 1.0000 | LOZENGE | OROMUCOSAL | Status: DC | PRN

## 2016-12-06 MED ORDER — DIPHENHYDRAMINE HCL 25 MG PO CAPS: 25.0000 mg | ORAL_CAPSULE | ORAL | Status: DC | PRN

## 2016-12-06 MED ORDER — OXYTOCIN 40 UNITS IN LACTATED RINGERS INFUSION - SIMPLE MED: 2.5000 [IU]/h | INTRAVENOUS | Status: AC

## 2016-12-06 SURGICAL SUPPLY — 36 items
BENZOIN TINCTURE PRP APPL 2/3 (GAUZE/BANDAGES/DRESSINGS) ×3 IMPLANT
CHLORAPREP W/TINT 26ML (MISCELLANEOUS) ×3 IMPLANT
CLAMP CORD UMBIL (MISCELLANEOUS) ×6 IMPLANT
CLOSURE WOUND 1/2 X4 (GAUZE/BANDAGES/DRESSINGS) ×1
CLOTH BEACON ORANGE TIMEOUT ST (SAFETY) ×3 IMPLANT
DERMABOND ADVANCED (GAUZE/BANDAGES/DRESSINGS) ×2
DERMABOND ADVANCED .7 DNX12 (GAUZE/BANDAGES/DRESSINGS) ×1 IMPLANT
DRSG OPSITE POSTOP 4X10 (GAUZE/BANDAGES/DRESSINGS) ×3 IMPLANT
ELECT REM PT RETURN 9FT ADLT (ELECTROSURGICAL) ×3
ELECTRODE REM PT RTRN 9FT ADLT (ELECTROSURGICAL) ×1 IMPLANT
EXTRACTOR VACUUM M CUP 4 TUBE (SUCTIONS) IMPLANT
EXTRACTOR VACUUM M CUP 4' TUBE (SUCTIONS)
GLOVE BIO SURGEON STRL SZ 6.5 (GLOVE) ×2 IMPLANT
GLOVE BIO SURGEONS STRL SZ 6.5 (GLOVE) ×1
GLOVE BIOGEL PI IND STRL 7.0 (GLOVE) ×2 IMPLANT
GLOVE BIOGEL PI INDICATOR 7.0 (GLOVE) ×4
GOWN STRL REUS W/TWL LRG LVL3 (GOWN DISPOSABLE) ×6 IMPLANT
KIT ABG SYR 3ML LUER SLIP (SYRINGE) IMPLANT
NDL SAFETY ECLIPSE 18X1.5 (NEEDLE) ×1 IMPLANT
NEEDLE HYPO 18GX1.5 SHARP (NEEDLE) ×2
NEEDLE HYPO 25X5/8 SAFETYGLIDE (NEEDLE) IMPLANT
NS IRRIG 1000ML POUR BTL (IV SOLUTION) ×3 IMPLANT
PACK C SECTION WH (CUSTOM PROCEDURE TRAY) ×3 IMPLANT
PAD OB MATERNITY 4.3X12.25 (PERSONAL CARE ITEMS) ×3 IMPLANT
PENCIL SMOKE EVAC W/HOLSTER (ELECTROSURGICAL) ×3 IMPLANT
STRIP CLOSURE SKIN 1/2X4 (GAUZE/BANDAGES/DRESSINGS) ×2 IMPLANT
SUT CHROMIC 0 CT 802H (SUTURE) IMPLANT
SUT CHROMIC 0 CTX 36 (SUTURE) ×9 IMPLANT
SUT MON AB-0 CT1 36 (SUTURE) ×3 IMPLANT
SUT PDS AB 0 CTX 60 (SUTURE) ×3 IMPLANT
SUT PLAIN 0 NONE (SUTURE) IMPLANT
SUT VIC AB 4-0 KS 27 (SUTURE) IMPLANT
SYR BULB 3OZ (MISCELLANEOUS) ×6 IMPLANT
SYRINGE 10CC LL (SYRINGE) ×6 IMPLANT
TOWEL OR 17X24 6PK STRL BLUE (TOWEL DISPOSABLE) ×3 IMPLANT
TRAY FOLEY BAG SILVER LF 14FR (SET/KITS/TRAYS/PACK) IMPLANT

## 2016-12-06 NOTE — Progress Notes (Signed)
Notified Dr. Langston MaskerMorris that blood Units are ready if needed, per [place] in Lab.

## 2016-12-06 NOTE — Anesthesia Postprocedure Evaluation (Signed)
Anesthesia Post Note  Patient: Lynn CraverHaley Navia  Procedure(s) Performed: CESAREAN SECTION MULTI-GESTATIONAL (N/A )     Patient location during evaluation: PACU Anesthesia Type: Spinal Level of consciousness: awake Pain management: pain level controlled Vital Signs Assessment: post-procedure vital signs reviewed and stable Respiratory status: spontaneous breathing Cardiovascular status: stable Postop Assessment: no headache, no backache, spinal receding, patient able to bend at knees and no apparent nausea or vomiting Anesthetic complications: no    Last Vitals:  Vitals:   12/06/16 1809 12/06/16 1930  BP: 123/68 131/78  Pulse: 100 84  Resp: 18 18  Temp:  36.6 C  SpO2: 100% 97%    Last Pain:  Vitals:   12/06/16 1930  TempSrc: Axillary  PainSc:    Pain Goal:                 Pesach Frisch JR,JOHN Modine Oppenheimer

## 2016-12-06 NOTE — Op Note (Signed)
Lynn Zamora PROCEDURE DATE: 12/06/2016  PREOPERATIVE DIAGNOSIS: Intrauterine pregnancy at  4349w0d weeks gestation with labor and di/di twins  POSTOPERATIVE DIAGNOSIS: The same  PROCEDURE:  Primary Low Transverse Cesarean Section  SURGEON:  Dr. Mitchel HonourMegan Ajna Moors  INDICATIONS: Lynn CraverHaley Zamora is a 26 y.o. G2P1001 at 1549w0d scheduled for cesarean section secondary to labor with di/di twins.  The risks of cesarean section discussed with the patient included but were not limited to: bleeding which may require transfusion or reoperation; infection which may require antibiotics; injury to bowel, bladder, ureters or other surrounding organs; injury to the fetus; need for additional procedures including hysterectomy in the event of a life-threatening hemorrhage; placental abnormalities wth subsequent pregnancies, incisional problems, thromboembolic phenomenon and other postoperative/anesthesia complications. The patient concurred with the proposed plan, giving informed written consent for the procedure.    FINDINGS:  Twin A-Viable female infant in cephalic presentation, APGARs 8,9:  Weight pending.  Twin B-Viable female infant with hand presentation, APGARs 8,9:  Weight pending.  Clear amniotic fluid.  Intact placenta, three vessel cord.  Grossly normal uterus, ovaries and fallopian tubes. .   ANESTHESIA:  Spinal ESTIMATED BLOOD LOSS: 400 mL ml SPECIMENS: Placenta sent to pathology COMPLICATIONS: None immediate  PROCEDURE IN DETAIL:  The patient received intravenous antibiotics and had sequential compression devices applied to her lower extremities while in the preoperative area.  She was then taken to the operating room where spinal anesthesia was administered and was found to be adequate. She was then placed in a dorsal supine position with a leftward tilt, and prepped and draped in a sterile manner.  A foley catheter was placed into her bladder and attached to constant gravity.  After an adequate timeout  was performed, a Pfannenstiel skin incision was made with scalpel and carried through to the underlying layer of fascia. The fascia was incised in the midline and this incision was extended bilaterally using the Mayo scissors. Kocher clamps were applied to the superior aspect of the fascial incision and the underlying rectus muscles were dissected off bluntly. A similar process was carried out on the inferior aspect of the facial incision. The rectus muscles were separated in the midline bluntly and the peritoneum was entered bluntly.  Bladder flap was created sharply and developed bluntly.  Bladder blade was placed.  A transverse hysterotomy was made with a scalpel and extended bilaterally bluntly. The bladder blade was then removed. Twin A was successfully delivered, and cord was clamped and cut and infant was handed over to awaiting neonatology team. Twin B was successfully delivered, and cord was clamped and cut and infant was handed over to awaiting neonatology team.  Uterine massage was then administered and the placenta delivered intact with three-vessel cord. The uterus was cleared of clot and debris.  The hysterotomy was closed with 0 chromic.  A second imbricating suture of 0-chromic was used to reinforce the incision and aid in hemostasis.  The peritoneum and rectus muscles were noted to be hemostatic and were reapproximated using 2-0 monocryl in a running fashion.  The fascia was closed with 0-Vicryl in a running fashion with good restoration of anatomy.  The subcutaneus tissue was copiously irrigated.  The skin was closed with 4-0 vicryl in a subcuticular fashion.  Pt tolerated the procedure will.  All counts were correct x2.  Pt went to the recovery room in stable condition.

## 2016-12-06 NOTE — H&P (Addendum)
Lynn Zamora is a 26 y.o. female presenting for labor @ 37 wks; Di/Di twins. She reports labor symptoms ongoing and intensifying x 48 hours. No LOF or VB.  Active FM x 2. Rh negative.  History of PPH.  Patient has been counseled and is planning primary C/S. GBS pos.          OB History    Gravida Para Term Preterm AB Living   2 1 1     1    SAB TAB Ectopic Multiple Live Births         0 1     No past medical history on file.      Past Surgical History:  Procedure Laterality Date  . DILATATION AND CURETTAGE N/A 08/17/2015   [facility]Adkins[facility] Gretchen[facility]  . HERNIA REPAIR    . WISDOM TOOTH EXTRACTION     Family History: family history includes Heart disease in her maternal grandfather; Hypertension in her mother; Lung cancer in her paternal grandfather. Social History:  reports that  has never smoked. she has never used smokeless tobacco. She reports that she does not drink alcohol or use drugs.     Maternal Diabetes: No Genetic Screening: Declined Maternal Ultrasounds/Referrals: Normal Fetal Ultrasounds or other Referrals:  None Maternal Substance Abuse:  No Significant Maternal Medications:  None Significant Maternal Lab Results:  Rh neg, GBS pos Other Comments:  None  ROS History Exam Physical Exam  Gen - NAD CV - RRR Lungs - clear Abd - gravid, NT Ext - NT Prenatal labs: ABO, Rh: A/Negative/-- (05/14 0000) Antibody: Negative (05/14 0000) Rubella: Immune (05/14 0000) RPR: Nonreactive (05/14 0000)  HBsAg: Negative (05/14 0000)  HIV: Non-reactive (05/14 0000)  GBS:     Assessment/Plan: Twins @ 37 wks; Labor Pt prefer c-section for delivery Counseled re: risk of bleeding, infection, scarring, and damage to surrounding structures.  She understands the 1% risk of uterine rupture.  All questions were answered and the patient wishes to proceed.  Mitchel HonourMegan Kmari Brian, DO

## 2016-12-06 NOTE — Progress Notes (Signed)
Notified Dr. Langston MaskerMorris that patient's antibody screen was Positive (+).  According to prenatal, last Rhogam was 10/17/16.

## 2016-12-06 NOTE — Anesthesia Procedure Notes (Signed)
Spinal  Patient location during procedure: OR Start time: 12/06/2016 3:26 PM End time: 12/06/2016 3:28 PM Reason for block: procedure for pain Staffing Anesthesiologist: Leilani AbleHatchett, Pura Picinich, MD Performed: anesthesiologist  Preanesthetic Checklist Completed: patient identified, site marked, surgical consent, pre-op evaluation, timeout performed, IV checked, risks and benefits discussed and monitors and equipment checked Spinal Block Patient position: sitting Prep: site prepped and draped and DuraPrep Patient monitoring: continuous pulse ox and blood pressure Approach: midline Location: L3-4 Injection technique: single-shot Needle Needle type: Pencan  Needle gauge: 24 G Needle length: 10 cm Needle insertion depth: 5 cm Assessment Sensory level: T4

## 2016-12-06 NOTE — Anesthesia Preprocedure Evaluation (Signed)
Anesthesia Evaluation  Patient identified by MRN, date of birth, ID band Patient awake    Reviewed: Allergy & Precautions, H&P , NPO status , Patient's Chart, lab work & pertinent test results  Airway Mallampati: II  TM Distance: >3 FB Neck ROM: full    Dental no notable dental hx. (+) Teeth Intact   Pulmonary neg pulmonary ROS,    Pulmonary exam normal breath sounds clear to auscultation       Cardiovascular negative cardio ROS   Rhythm:regular Rate:Normal     Neuro/Psych negative neurological ROS  negative psych ROS   GI/Hepatic negative GI ROS, Neg liver ROS,   Endo/Other  negative endocrine ROS  Renal/GU negative Renal ROS     Musculoskeletal negative musculoskeletal ROS (+)   Abdominal (+) + obese,   Peds  Hematology negative hematology ROS (+)   Anesthesia Other Findings   Reproductive/Obstetrics (+) Pregnancy                             Anesthesia Physical Anesthesia Plan  ASA: II  Anesthesia Plan: Spinal   Post-op Pain Management:    Induction:   PONV Risk Score and Plan: 2 and Scopolamine patch - Pre-op, Dexamethasone and Ondansetron  Airway Management Planned: Natural Airway, Nasal Cannula and Simple Face Mask  Additional Equipment:   Intra-op Plan:   Post-operative Plan:   Informed Consent: I have reviewed the patients History and Physical, chart, labs and discussed the procedure including the risks, benefits and alternatives for the proposed anesthesia with the patient or authorized representative who has indicated his/her understanding and acceptance.     Plan Discussed with: CRNA and Surgeon  Anesthesia Plan Comments:         Anesthesia Quick Evaluation

## 2016-12-06 NOTE — Transfer of Care (Signed)
Immediate Anesthesia Transfer of Care Note  Patient: Lynn Zamora  Procedure(s) Performed: CESAREAN SECTION MULTI-GESTATIONAL (N/A )  Patient Location: PACU  Anesthesia Type:Spinal  Level of Consciousness: awake  Airway & Oxygen Therapy: Patient Spontanous Breathing  Post-op Assessment: Report given to RN  Post vital signs: Reviewed and stable  Last Vitals:  Vitals:   12/06/16 1141 12/06/16 1254  BP:    Pulse: (!) 120 (!) 134  Resp:    Temp:  36.7 C  SpO2: 98% 99%    Last Pain:  Vitals:   12/06/16 1254  TempSrc: Oral  PainSc:          Complications: No apparent anesthesia complications

## 2016-12-06 NOTE — MAU Note (Signed)
Patient noticed an increase in contractions and vaginal pressure this morning.  States contractions are about every 7 min.  Denies vaginal LOF or bleeding.  Plans c section 12/17/16.

## 2016-12-07 ENCOUNTER — Encounter (HOSPITAL_COMMUNITY): Payer: Self-pay | Admitting: Obstetrics & Gynecology

## 2016-12-07 LAB — CBC
HCT: 30.9 % — ABNORMAL LOW (ref 36.0–46.0)
HEMOGLOBIN: 10.6 g/dL — AB (ref 12.0–15.0)
MCH: 31.7 pg (ref 26.0–34.0)
MCHC: 34.3 g/dL (ref 30.0–36.0)
MCV: 92.5 fL (ref 78.0–100.0)
Platelets: 139 10*3/uL — ABNORMAL LOW (ref 150–400)
RBC: 3.34 MIL/uL — ABNORMAL LOW (ref 3.87–5.11)
RDW: 13.7 % (ref 11.5–15.5)
WBC: 20.4 10*3/uL — AB (ref 4.0–10.5)

## 2016-12-07 LAB — RPR: RPR Ser Ql: NONREACTIVE

## 2016-12-07 MED ORDER — RHO D IMMUNE GLOBULIN 1500 UNIT/2ML IJ SOSY
300.0000 ug | PREFILLED_SYRINGE | Freq: Once | INTRAMUSCULAR | Status: AC
  Administered 2016-12-07: 300 ug via INTRAVENOUS
  Filled 2016-12-07: qty 2

## 2016-12-07 NOTE — Anesthesia Postprocedure Evaluation (Signed)
Anesthesia Post Note  Patient: Lynn CraverHaley Nauta  Procedure(s) Performed: CESAREAN SECTION MULTI-GESTATIONAL (N/A )     Patient location during evaluation: Mother Baby Anesthesia Type: Spinal Level of consciousness: awake Pain management: satisfactory to patient Vital Signs Assessment: post-procedure vital signs reviewed and stable Respiratory status: spontaneous breathing Cardiovascular status: stable Anesthetic complications: no    Last Vitals:  Vitals:   12/07/16 0745 12/07/16 0930  BP:  103/64  Pulse:  78  Resp:  18  Temp:    SpO2: 98% 99%    Last Pain:  Vitals:   12/07/16 0930  TempSrc:   PainSc: 0-No pain   Pain Goal: Patients Stated Pain Goal: 3 (12/07/16 0520)               Cephus ShellingBURGER,Norvell Ureste

## 2016-12-07 NOTE — Progress Notes (Signed)
Subjective: Postpartum Day 1: Cesarean Delivery Patient reports tolerating PO.  No void since foley out.  Adequate pain control with tylenol and NSAIDs.  Objective: Vital signs in last 24 hours: Temp:  [97.4 F (36.3 C)-99 F (37.2 C)] 97.4 F (36.3 C) (11/23 0520) Pulse Rate:  [78-137] 80 (11/23 0520) Resp:  [16-24] 18 (11/23 0520) BP: (103-136)/(61-85) 103/61 (11/23 0520) SpO2:  [93 %-100 %] 98 % (11/23 0745)  Physical Exam:  General: alert, cooperative and appears stated age Lochia: appropriate Uterine Fundus: firm Incision: healing well, no significant drainage, no dehiscence DVT Evaluation: No evidence of DVT seen on physical exam. Negative Homan's sign. No cords or calf tenderness.  Recent Labs    12/06/16 1318 12/07/16 0550  HGB 12.0 10.6*  HCT 36.3 30.9*    Assessment/Plan: Status post Cesarean section. Doing well postoperatively.  Continue current care.  Lynn Zamora 12/07/2016, 9:41 AM

## 2016-12-07 NOTE — Plan of Care (Signed)
Tolerating increase in activity, VSS, I&O, assessments WNL. Pain controlled with ibuprofen and tylenol. Bonding well with newborns.

## 2016-12-07 NOTE — Addendum Note (Signed)
Addendum  created 12/07/16 1033 by Algis GreenhouseBurger, Gabriele Loveland A, CRNA   Sign clinical note

## 2016-12-07 NOTE — Progress Notes (Signed)
Patient up ad lib ambulating in hallway.

## 2016-12-08 LAB — RH IG WORKUP (INCLUDES ABO/RH)
ABO/RH(D): A NEG
Fetal Screen: NEGATIVE
GESTATIONAL AGE(WKS): 37
UNIT DIVISION: 0

## 2016-12-08 MED ORDER — IBUPROFEN 600 MG PO TABS: 600.0000 mg | ORAL_TABLET | Freq: Four times a day (QID) | ORAL | 0 refills | Status: DC

## 2016-12-08 MED ORDER — OXYCODONE-ACETAMINOPHEN 5-325 MG PO TABS: 1.0000 | ORAL_TABLET | ORAL | 0 refills | Status: DC | PRN

## 2016-12-08 NOTE — Discharge Instructions (Signed)
Call MD for T>100.4, heavy vaginal bleeding, severe abdominal pain, intractable nausea and/or vomiting, or respiratory distress.  Call office to schedule postop incision check in 1-2 weeks.  No driving while taking narcotics.  Pelvic rest x 6 weeks. °

## 2016-12-08 NOTE — Discharge Summary (Signed)
Obstetric Discharge Summary Reason for Admission: onset of labor and twins Prenatal Procedures: none Intrapartum Procedures: cesarean: low cervical, transverse Postpartum Procedures: none Complications-Operative and Postpartum: none Hemoglobin  Date Value Ref Range Status  12/07/2016 10.6 (L) 12.0 - 15.0 g/dL Final   HCT  Date Value Ref Range Status  12/07/2016 30.9 (L) 36.0 - 46.0 % Final    Physical Exam:  General: alert, cooperative and appears stated age 31Lochia: appropriate Uterine Fundus: firm Incision: healing well, no significant drainage, no dehiscence DVT Evaluation: No evidence of DVT seen on physical exam. Negative Homan's sign. No cords or calf tenderness.  Discharge Diagnoses: Term Pregnancy-delivered and twins  Discharge Information: Date: 12/08/2016 Activity: pelvic rest Diet: routine Medications: None, Ibuprofen and Percocet Condition: stable Instructions: refer to practice specific booklet Discharge to: home   Newborn Data:   Lynn Zamora, Lynn Zamora [[id number]][[id number]]  Live born female  Birth Weight: 5 lb 9.1 oz (2525 g) APGAR: 8, 9  Newborn Delivery   Birth date/time:  12/06/2016 15:45:00 Delivery type:  C-Section, Low Transverse C-section categorization:  Primary      Lynn Zamora, Lynn Zamora [[id number]][[id number]]  Live born female  Birth Weight: 5 lb 5.4 oz (2420 g) APGAR: 8, 9  Newborn Delivery   Birth date/time:  12/06/2016 15:46:00 Delivery type:  C-Section, Low Transverse C-section categorization:  Primary     Home with mother.  Lynn Zamora 12/08/2016, 9:24 AM

## 2016-12-08 NOTE — Progress Notes (Signed)
Subjective: Postpartum Day 2: Cesarean Delivery Patient reports tolerating PO, + flatus and no problems voiding.    Objective: Vital signs in last 24 hours: Temp:  [97.3 F (36.3 C)-97.6 F (36.4 C)] 97.6 F (36.4 C) (11/24 0519) Pulse Rate:  [71-88] 86 (11/24 0519) Resp:  [18-20] 18 (11/24 0519) BP: (101-114)/(60-70) 114/70 (11/24 0519) SpO2:  [99 %] 99 % (11/23 0930)  Physical Exam:  General: alert, cooperative and appears stated age Lochia: appropriate Uterine Fundus: firm Incision: healing well, no significant drainage, no dehiscence DVT Evaluation: No evidence of DVT seen on physical exam. Negative Homan's sign. No cords or calf tenderness.  Recent Labs    12/06/16 1318 12/07/16 0550  HGB 12.0 10.6*  HCT 36.3 30.9*    Assessment/Plan: Status post Cesarean section. Doing well postoperatively.  Discharge home with standard precautions and return to clinic in 4-6 weeks.  Crystale Giannattasio 12/08/2016, 9:19 AM

## 2016-12-10 LAB — TYPE AND SCREEN
ABO/RH(D): A NEG
ANTIBODY SCREEN: POSITIVE
Unit division: 0
Unit division: 0

## 2016-12-10 LAB — BPAM RBC
BLOOD PRODUCT EXPIRATION DATE: 201812052359
Blood Product Expiration Date: 201812182359
ISSUE DATE / TIME: 201811251828
UNIT TYPE AND RH: 9500
Unit Type and Rh: 9500

## 2016-12-11 ENCOUNTER — Other Ambulatory Visit: Payer: 59

## 2016-12-14 ENCOUNTER — Encounter (HOSPITAL_COMMUNITY)
Admission: RE | Admit: 2016-12-14 | Discharge: 2016-12-14 | Disposition: A | Discharge status: Home / Self Care | Payer: 59 | Source: Ambulatory Visit

## 2016-12-17 ENCOUNTER — Inpatient Hospital Stay (HOSPITAL_COMMUNITY): Admission: AD | Admit: 2016-12-17 | Payer: 59 | Source: Ambulatory Visit | Admitting: Obstetrics and Gynecology

## 2017-01-23 DIAGNOSIS — Z1389 Encounter for screening for other disorder: Secondary | ICD-10-CM | POA: Diagnosis not present

## 2019-02-20 ENCOUNTER — Other Ambulatory Visit: Payer: 59

## 2019-04-14 DIAGNOSIS — Z01419 Encounter for gynecological examination (general) (routine) without abnormal findings: Secondary | ICD-10-CM | POA: Diagnosis not present

## 2019-04-14 DIAGNOSIS — Z6830 Body mass index (BMI) 30.0-30.9, adult: Secondary | ICD-10-CM | POA: Diagnosis not present

## 2019-04-14 DIAGNOSIS — Z3040 Encounter for surveillance of contraceptives, unspecified: Secondary | ICD-10-CM | POA: Diagnosis not present

## 2019-08-11 DIAGNOSIS — Z23 Encounter for immunization: Secondary | ICD-10-CM | POA: Diagnosis not present

## 2019-09-01 DIAGNOSIS — Z23 Encounter for immunization: Secondary | ICD-10-CM | POA: Diagnosis not present

## 2019-10-14 DIAGNOSIS — J069 Acute upper respiratory infection, unspecified: Secondary | ICD-10-CM | POA: Diagnosis not present

## 2019-10-31 DIAGNOSIS — H6901 Patulous Eustachian tube, right ear: Secondary | ICD-10-CM | POA: Diagnosis not present

## 2019-11-13 DIAGNOSIS — N911 Secondary amenorrhea: Secondary | ICD-10-CM | POA: Diagnosis not present

## 2019-11-20 DIAGNOSIS — Z3481 Encounter for supervision of other normal pregnancy, first trimester: Secondary | ICD-10-CM | POA: Diagnosis not present

## 2019-11-20 DIAGNOSIS — Z3685 Encounter for antenatal screening for Streptococcus B: Secondary | ICD-10-CM | POA: Diagnosis not present

## 2019-11-20 LAB — OB RESULTS CONSOLE ANTIBODY SCREEN: Antibody Screen: NEGATIVE

## 2019-11-20 LAB — OB RESULTS CONSOLE ABO/RH: RH Type: NEGATIVE

## 2019-11-20 LAB — OB RESULTS CONSOLE GC/CHLAMYDIA
Chlamydia: NEGATIVE
Gonorrhea: NEGATIVE

## 2019-11-20 LAB — OB RESULTS CONSOLE HIV ANTIBODY (ROUTINE TESTING): HIV: NONREACTIVE

## 2019-11-20 LAB — OB RESULTS CONSOLE RUBELLA ANTIBODY, IGM: Rubella: IMMUNE

## 2019-11-20 LAB — OB RESULTS CONSOLE HEPATITIS B SURFACE ANTIGEN: Hepatitis B Surface Ag: NEGATIVE

## 2019-11-20 LAB — OB RESULTS CONSOLE RPR: RPR: NONREACTIVE

## 2019-12-01 DIAGNOSIS — Z113 Encounter for screening for infections with a predominantly sexual mode of transmission: Secondary | ICD-10-CM | POA: Diagnosis not present

## 2019-12-01 DIAGNOSIS — Z3481 Encounter for supervision of other normal pregnancy, first trimester: Secondary | ICD-10-CM | POA: Diagnosis not present

## 2019-12-24 DIAGNOSIS — Z3682 Encounter for antenatal screening for nuchal translucency: Secondary | ICD-10-CM | POA: Diagnosis not present

## 2019-12-24 DIAGNOSIS — Z3A13 13 weeks gestation of pregnancy: Secondary | ICD-10-CM | POA: Diagnosis not present

## 2019-12-24 DIAGNOSIS — Z36 Encounter for antenatal screening for chromosomal anomalies: Secondary | ICD-10-CM | POA: Diagnosis not present

## 2020-02-02 DIAGNOSIS — Z3400 Encounter for supervision of normal first pregnancy, unspecified trimester: Secondary | ICD-10-CM | POA: Diagnosis not present

## 2020-02-02 DIAGNOSIS — Z363 Encounter for antenatal screening for malformations: Secondary | ICD-10-CM | POA: Diagnosis not present

## 2020-02-02 DIAGNOSIS — Z361 Encounter for antenatal screening for raised alphafetoprotein level: Secondary | ICD-10-CM | POA: Diagnosis not present

## 2020-02-02 DIAGNOSIS — Z3A19 19 weeks gestation of pregnancy: Secondary | ICD-10-CM | POA: Diagnosis not present

## 2020-03-30 DIAGNOSIS — Z7689 Persons encountering health services in other specified circumstances: Secondary | ICD-10-CM | POA: Diagnosis not present

## 2020-03-30 DIAGNOSIS — O4402 Placenta previa specified as without hemorrhage, second trimester: Secondary | ICD-10-CM | POA: Diagnosis not present

## 2020-03-30 DIAGNOSIS — Z3A27 27 weeks gestation of pregnancy: Secondary | ICD-10-CM | POA: Diagnosis not present

## 2020-03-30 DIAGNOSIS — Z3400 Encounter for supervision of normal first pregnancy, unspecified trimester: Secondary | ICD-10-CM | POA: Diagnosis not present

## 2020-03-30 DIAGNOSIS — Z23 Encounter for immunization: Secondary | ICD-10-CM | POA: Diagnosis not present

## 2020-04-14 DIAGNOSIS — Z3480 Encounter for supervision of other normal pregnancy, unspecified trimester: Secondary | ICD-10-CM | POA: Diagnosis not present

## 2020-05-29 ENCOUNTER — Encounter (HOSPITAL_COMMUNITY): Payer: Self-pay | Admitting: Obstetrics and Gynecology

## 2020-05-29 ENCOUNTER — Inpatient Hospital Stay (HOSPITAL_COMMUNITY)
Admission: AD | Admit: 2020-05-29 | Discharge: 2020-05-29 | Disposition: A | Discharge status: Home / Self Care | Payer: 59 | Attending: Obstetrics and Gynecology | Admitting: Obstetrics and Gynecology

## 2020-05-29 ENCOUNTER — Other Ambulatory Visit: Payer: Self-pay

## 2020-05-29 ENCOUNTER — Inpatient Hospital Stay (HOSPITAL_BASED_OUTPATIENT_CLINIC_OR_DEPARTMENT_OTHER): Payer: 59

## 2020-05-29 DIAGNOSIS — O9A213 Injury, poisoning and certain other consequences of external causes complicating pregnancy, third trimester: Secondary | ICD-10-CM | POA: Diagnosis not present

## 2020-05-29 DIAGNOSIS — S3981XA Other specified injuries of abdomen, initial encounter: Secondary | ICD-10-CM | POA: Diagnosis not present

## 2020-05-29 DIAGNOSIS — Z3A35 35 weeks gestation of pregnancy: Secondary | ICD-10-CM | POA: Diagnosis not present

## 2020-05-29 DIAGNOSIS — X58XXXA Exposure to other specified factors, initial encounter: Secondary | ICD-10-CM | POA: Insufficient documentation

## 2020-05-29 DIAGNOSIS — S3630XA Unspecified injury of stomach, initial encounter: Secondary | ICD-10-CM

## 2020-05-29 DIAGNOSIS — O360130 Maternal care for anti-D [Rh] antibodies, third trimester, not applicable or unspecified: Secondary | ICD-10-CM

## 2020-05-29 DIAGNOSIS — O4693 Antepartum hemorrhage, unspecified, third trimester: Secondary | ICD-10-CM | POA: Diagnosis not present

## 2020-05-29 DIAGNOSIS — W501XXA Accidental kick by another person, initial encounter: Secondary | ICD-10-CM

## 2020-05-29 DIAGNOSIS — T1490XA Injury, unspecified, initial encounter: Secondary | ICD-10-CM

## 2020-05-29 DIAGNOSIS — N898 Other specified noninflammatory disorders of vagina: Secondary | ICD-10-CM

## 2020-05-29 DIAGNOSIS — O26893 Other specified pregnancy related conditions, third trimester: Secondary | ICD-10-CM

## 2020-05-29 LAB — URINALYSIS, ROUTINE W REFLEX MICROSCOPIC
Bilirubin Urine: NEGATIVE
Glucose, UA: NEGATIVE mg/dL
Hgb urine dipstick: NEGATIVE
Ketones, ur: NEGATIVE mg/dL
Nitrite: NEGATIVE
Protein, ur: NEGATIVE mg/dL
Specific Gravity, Urine: 1.005 (ref 1.005–1.030)
pH: 7 (ref 5.0–8.0)

## 2020-05-29 LAB — WET PREP, GENITAL
Clue Cells Wet Prep HPF POC: NONE SEEN
Sperm: NONE SEEN
Trich, Wet Prep: NONE SEEN
Yeast Wet Prep HPF POC: NONE SEEN

## 2020-05-29 IMAGING — US US MFM OB LIMITED
1 series · 14 of 28 positions shown · non-contrast
Comparison: none

[Series 1: us mfm ob limited · 14 of 37 slices shown]
[im 2/37]
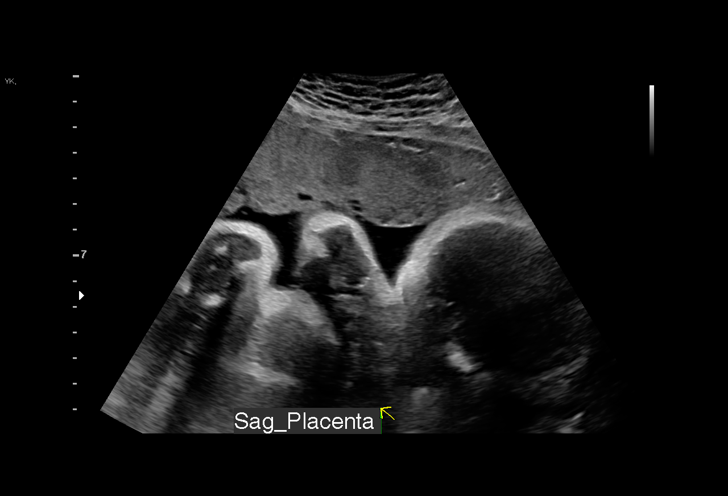
[im 5/37]
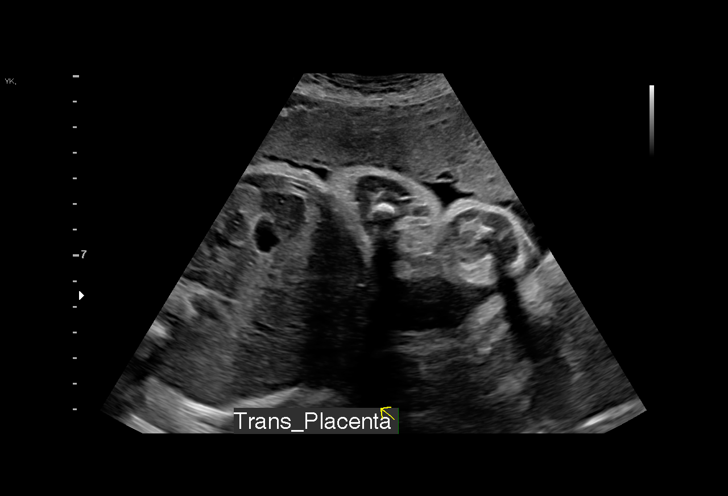
[im 7/37]
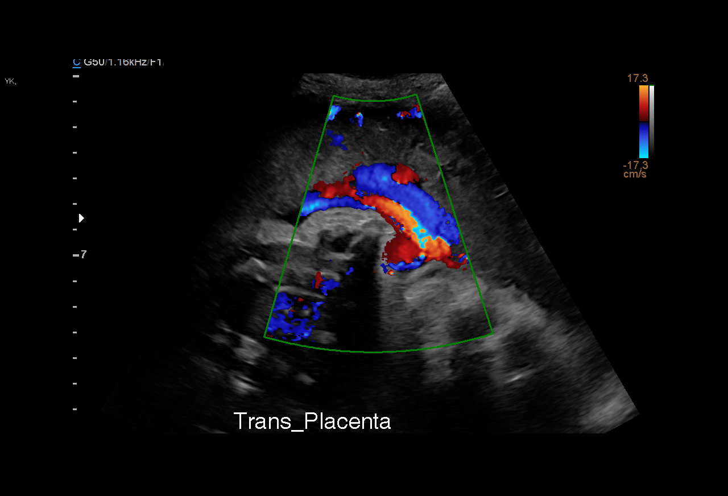
[im 10/37]
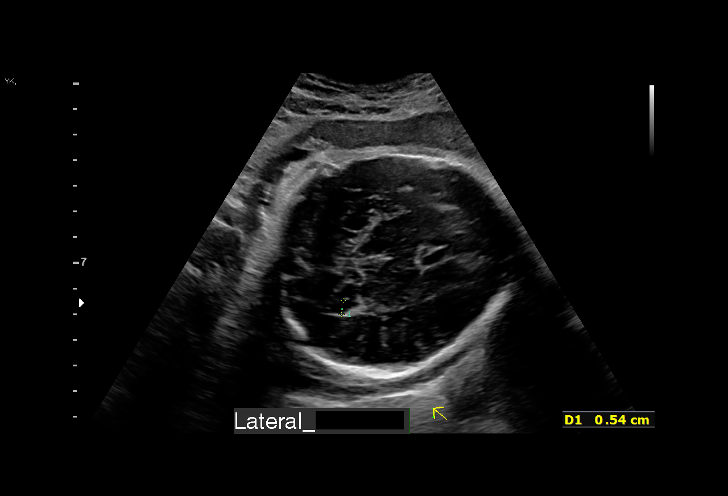
[im 13/37]
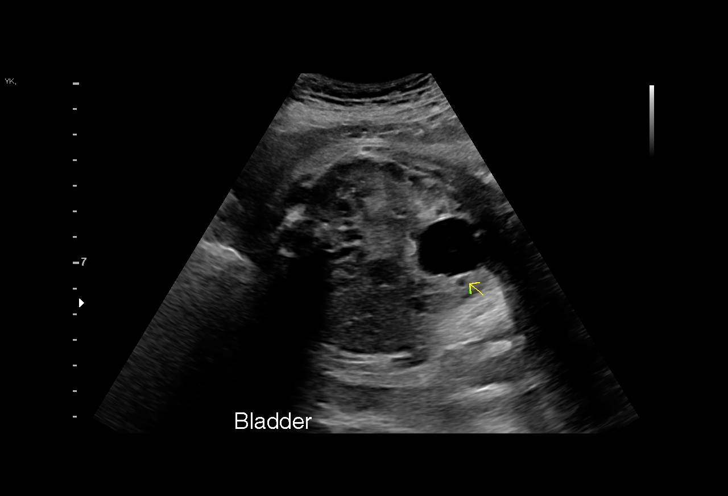
[im 15/37]
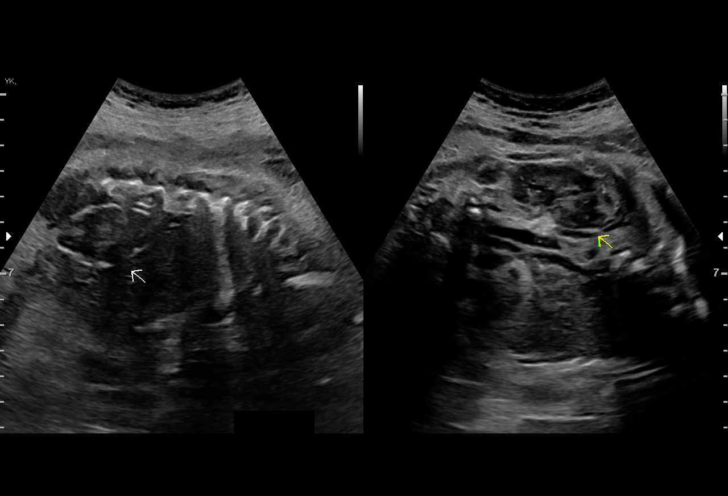
[im 18/37]
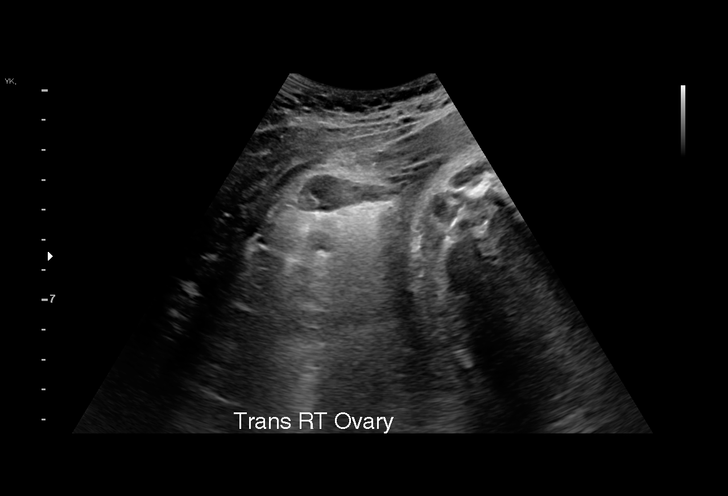
[im 21/37]
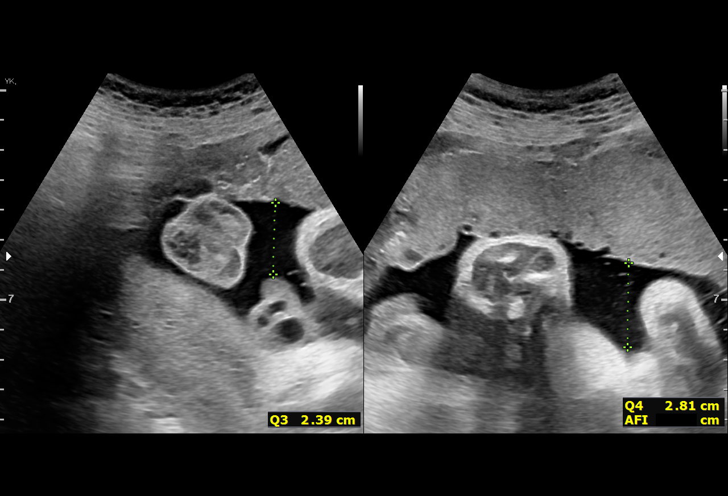
[im 23/37]
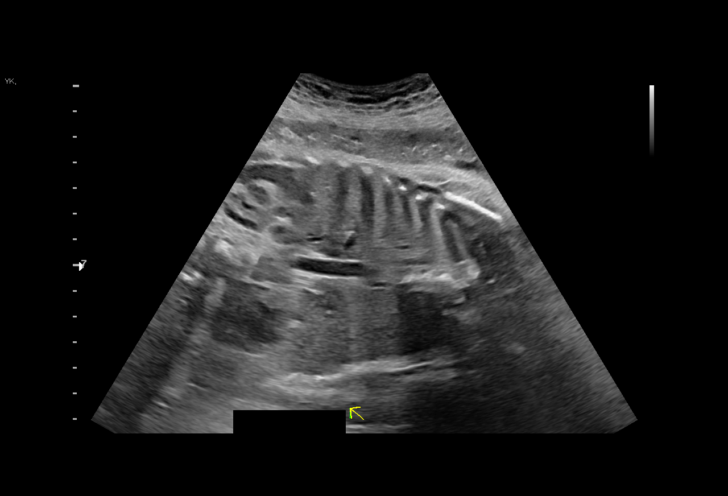
[im 26/37]
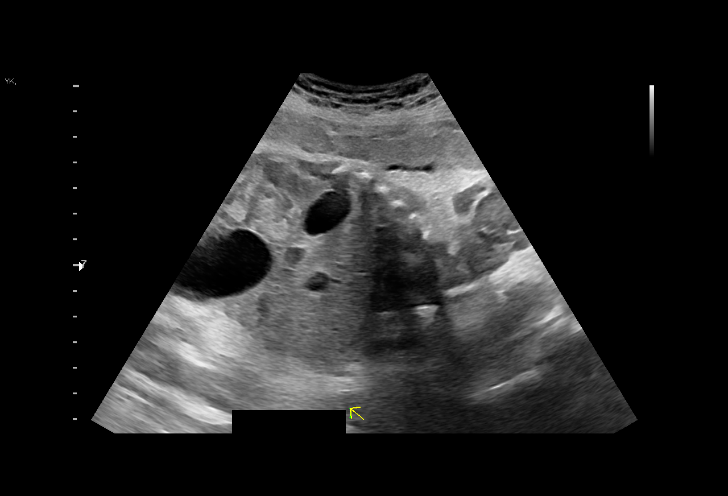
[im 29/37]
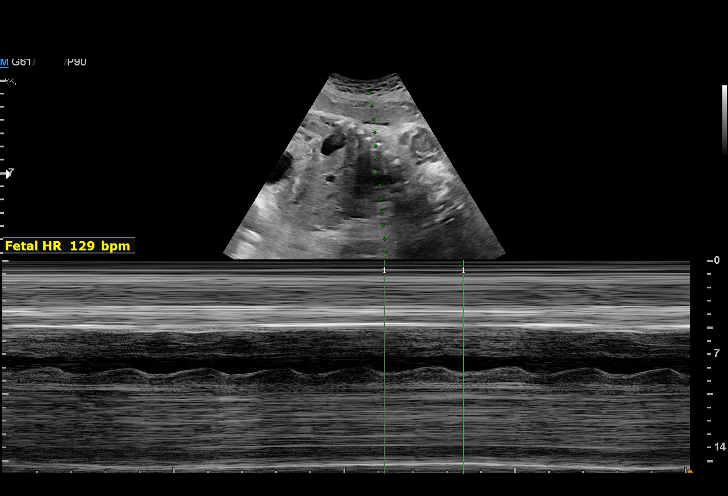
[im 31/37]
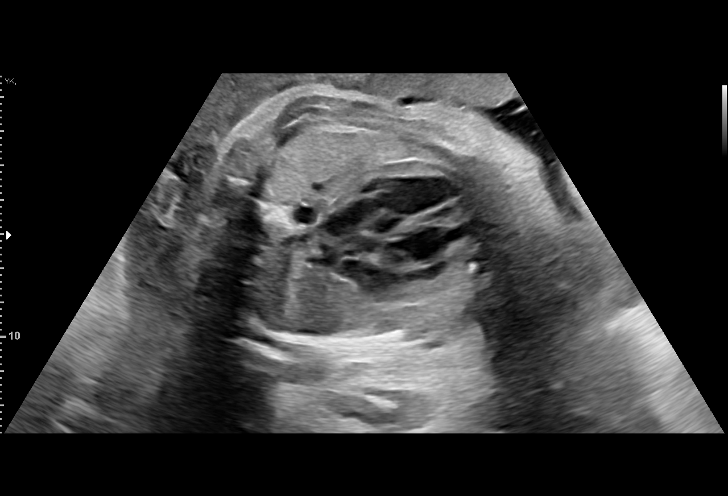
[im 34/37]
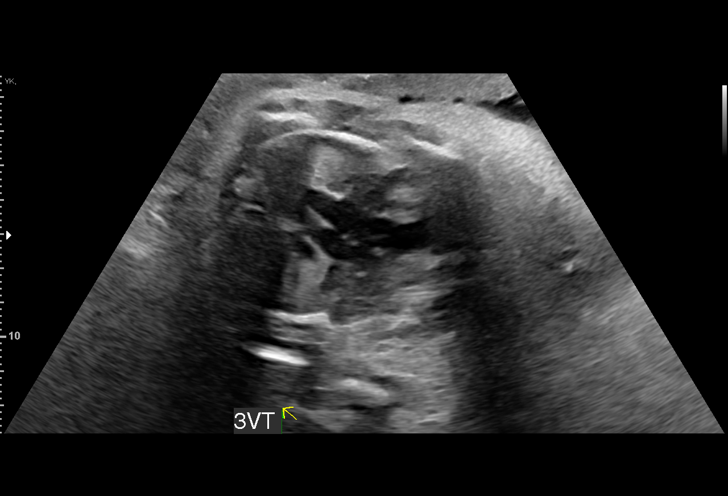
[im 37/37]
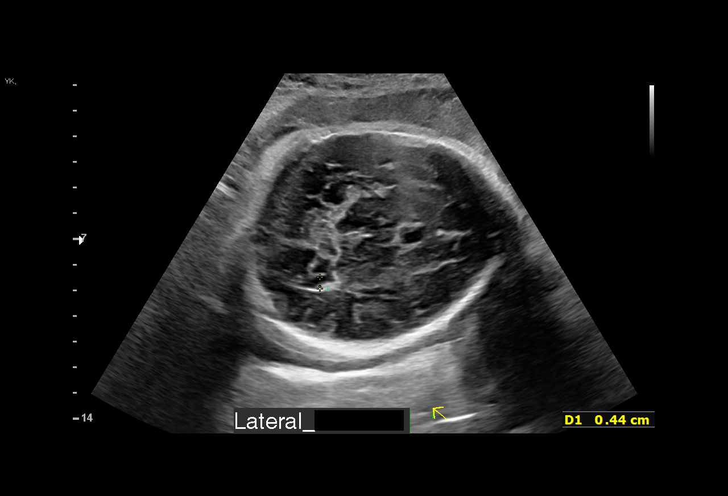

[14 of 28 positions shown; findings below may reference images not displayed]

1   US MFM OB LIMITED                    76815.01     [facility]

Indications

 Traumatic injury during pregnancy               O9A.219 [ZR]
 35 weeks gestation of pregnancy
 Rh negative state in antepartum                 [ZR]

Fetal Evaluation

 Num Of Fetuses:          1
 Fetal Heart Rate(bpm):   144
 Cardiac Activity:        Observed
 Presentation:            Cephalic
 Placenta:                Anterior
 P. Cord Insertion:       Visualized, central

 Amniotic Fluid
 AFI FV:      Within normal limits

 AFI Sum(cm)     %Tile       Largest Pocket(cm)
 11.56           33

 RUQ(cm)       RLQ(cm)        LUQ(cm)        LLQ(cm)


 Comment:     No placental abruption or previa identified.
OB History

 Gravidity:     3         Term:  2
 Living:        3
Gestational Age

 LMP:            35w 5d       Date:  [DATE]                   EDD:  [DATE]
 Best:           35w 5d    Det. By:  LMP  ([DATE])            EDD:  [DATE]
Anatomy

 Cranium:                Appears normal         Stomach:                Appears normal, left
                                                                        sided
 Cavum:                  Appears normal         Abdomen:                Appears normal
 Ventricles:             Appears normal         Cord Vessels:           Appears normal (3
                                                                        vessel cord)
 Thoracic:               Appears normal         Kidneys:                Appear normal
 Heart:                  Appears normal         Bladder:                Appears normal
                         (4CH, axis, and situs)
 Diaphragm:              Appears normal

 Other:   Technicallly difficult due to advanced GA and maternal habitus.
Cervix Uterus Adnexa

 Cervix
 Not visualized (advanced GA >[ZR])

 Uterus
 No abnormality visualized.

 Right Ovary
 No adnexal mass visualized.

 Left Ovary
 No adnexal mass visualized.

 Cul De Sac
 No free fluid seen.
Impression

 Patient was evaluated at the [facility] for c/o abdominal injury. No
 history of vaginal bleeding.
 A limited ultrasound study was performed .Amniotic fluid is
 normal and good fetal activity is seen. Placenta looks normal
 with no evidence of abruption. Ultrasound has limitations in
 diagnosing placental abruption .
                 [facility]

## 2020-05-29 NOTE — MAU Provider Note (Addendum)
Chief Complaint:  Trauma (Kicked in stomach )   Event Date/Time   First Provider Initiated Contact with Patient 05/29/20 1406      HPI: Lynn Zamora is a 30 y.o. G3P2003 at [redacted]w[redacted]d who presents to maternity admissions reporting her older children were playing nearby and one child landed on the couch next to her and her feet with shoes kicked into the left side of her abdomen. She denies any pain, bleeding, or cramping/contractions. She has no other s/sx and has not tried any treatments. She reports good fetal movement.      HPI  Past Medical History: History reviewed. No pertinent past medical history.  Past obstetric history: OB History  Gravida Para Term Preterm AB Living  3 2 2     3   SAB IAB Ectopic Multiple Live Births        1 3    # Outcome Date GA Lbr Len/2nd Weight Sex Delivery Anes PTL Lv  3 Current           2A Term 12/06/16 [redacted]w[redacted]d  2525 g F CS-LTranv Spinal  LIV  2B Term 12/06/16 [redacted]w[redacted]d  2420 g F CS-LTranv Spinal  LIV  1 Term 08/17/15 [redacted]w[redacted]d 17:02 / 02:08 3485 g F Vag-Spont EPI  LIV    Past Surgical History: Past Surgical History:  Procedure Laterality Date  . CESAREAN SECTION MULTI-GESTATIONAL N/A 12/06/2016   Procedure: CESAREAN SECTION MULTI-GESTATIONAL;  Surgeon: 12/08/2016, DO;  Location: [facility];  Service: Obstetrics;  Laterality: N/A;  Primary edc 12/27/16 NKDA need RNFA  . DILATION AND CURETTAGE OF UTERUS N/A 08/17/2015   Procedure: DILATATION AND CURETTAGE;  Surgeon: 10/17/2015, MD;  Location: [facility];  Service: Gynecology;  Laterality: N/A;  . HERNIA REPAIR    . WISDOM TOOTH EXTRACTION      Family History: Family History  Problem Relation Age of Onset  . Hypertension Mother   . Heart disease Maternal Grandfather   . Lung cancer Paternal Grandfather     Social History: Social History   Tobacco Use  . Smoking status: Never Smoker  . Smokeless tobacco: Never Used  Vaping Use  . Vaping Use: Never used  Substance Use Topics  .  Alcohol use: No  . Drug use: No    Allergies: No Known Allergies  Meds:  Medications Prior to Admission  Medication Sig Dispense Refill Last Dose  . Prenatal Vit-Fe Fumarate-FA (PRENATAL MULTIVITAMIN) TABS tablet Take 1 tablet by mouth at bedtime.    05/28/2020 at Unknown time  . ibuprofen (ADVIL,MOTRIN) 600 MG tablet Take 1 tablet (600 mg total) by mouth every 6 (six) hours. 30 tablet 0   . oxyCODONE-acetaminophen (PERCOCET/ROXICET) 5-325 MG tablet Take 1 tablet by mouth every 4 (four) hours as needed (pain scale 4-7). 30 tablet 0   . ranitidine (ZANTAC) 150 MG tablet Take 150 mg by mouth 2 (two) times daily as needed for heartburn.       ROS:  Review of Systems  Constitutional: Negative for chills, fatigue and fever.  Eyes: Negative for visual disturbance.  Respiratory: Negative for shortness of breath.   Cardiovascular: Negative for chest pain.  Gastrointestinal: Negative for abdominal pain, nausea and vomiting.  Genitourinary: Negative for difficulty urinating, dysuria, flank pain, pelvic pain, vaginal bleeding, vaginal discharge and vaginal pain.  Neurological: Negative for dizziness and headaches.  Psychiatric/Behavioral: Negative.      I have reviewed patient's Past Medical Hx, Surgical Hx, Family Hx, Social Hx, medications and allergies.  Physical Exam   Patient Vitals for the past 24 hrs:  BP Temp Temp src Pulse Resp SpO2 Weight  05/29/20 1313 134/82 98.1 F (36.7 C) Oral (!) 104 16 99 % --  05/29/20 1304 -- -- -- -- -- -- 86.9 kg   Constitutional: Well-developed, well-nourished female in no acute distress.  Cardiovascular: normal rate Respiratory: normal effort GI: Abd soft, non-tender, gravid appropriate for gestational age.  MS: Extremities nontender, no edema, normal ROM Neurologic: Alert and oriented x 4.  GU: Neg CVAT.  PELVIC EXAM: Cervix pink, visually closed, without lesion, scant white creamy discharge, vaginal walls and external genitalia  normal Bimanual exam: Cervix 0/long/high, firm, anterior, neg CMT, uterus nontender, nonenlarged, adnexa without tenderness, enlargement, or mass     FHT:  Baseline 135 , moderate variability, accelerations present, no decelerations Contractions: irregular, mild to palpation   Labs: Results for orders placed or performed during the hospital encounter of 05/29/20 (from the past 24 hour(s))  Urinalysis, Routine w reflex microscopic Urine, Clean Catch     Status: Abnormal   Collection Time: 05/29/20  1:02 PM  Result Value Ref Range   Color, Urine STRAW (A) YELLOW   APPearance CLEAR CLEAR   Specific Gravity, Urine 1.005 1.005 - 1.030   pH 7.0 5.0 - 8.0   Glucose, UA NEGATIVE NEGATIVE mg/dL   Hgb urine dipstick NEGATIVE NEGATIVE   Bilirubin Urine NEGATIVE NEGATIVE   Ketones, ur NEGATIVE NEGATIVE mg/dL   Protein, ur NEGATIVE NEGATIVE mg/dL   Nitrite NEGATIVE NEGATIVE   Leukocytes,Ua LARGE (A) NEGATIVE   RBC / HPF 6-10 0 - 5 RBC/hpf   WBC, UA 0-5 0 - 5 WBC/hpf   Bacteria, UA RARE (A) NONE SEEN   Squamous Epithelial / LPF 6-10 0 - 5      Imaging:  No results found. Preliminary LImited OB MFM  Normal FHR, amniotic fluid, placenta  See final report from MFM  MAU Course/MDM: Orders Placed This Encounter  Procedures  . Urinalysis, Routine w reflex microscopic Urine, Clean Catch  . Diet regular Room service appropriate? Yes; Fluid consistency: Thin  . Discharge patient    No orders of the defined types were placed in this encounter.    NST reviewed and reactive. Monitoring up to 4 hours after abdominal trauma occurred.  Pt without pain, LOF, vaginal bleeding or other complaints.  She reports good fetal movement She received routine Rhogam injection for Rh negative at 28 weeks  Pt discharge with strict return precautions.  Addendum: Pt to bathroom prior to d/c from MAU and reported light red bleeding with wiping. She reports some occasional pink when wiping she attributes to  vaginal irritation due to urine leaking with cough/laugh/sneeze and/or wearing a pad.  However, today, it was more red in color but still only when wiping, none on pad afterwards.  Pt to [place], which found no visual evidence of placental abnormalities.  SSE with no bleeding in vaginal vault, and some erythema of left labia. HSV swab collected with some broken skin but no visible lesion and no active bleeding.  Bleeding most likely from external labial area.  Try topical hydrocortisone for irritation, consider wearing cotton disposable or washable pads for less irritation.  F/U in OB office this week. Return to MAU as needed for emergencies or signs of labor.   Assessment: 1. Traumatic injury during pregnancy in third trimester   2. [redacted] weeks gestation of pregnancy   3. Rh negative state in antepartum period, third trimester  Plan: Discharge home with bleeding precautions Labor precautions and fetal kick counts  Follow-up Information    [place] Follow up.   Contact information: [address] [place] [address] [phone]              Allergies as of 05/29/2020   No Known Allergies     Medication List    STOP taking these medications   ibuprofen 600 MG tablet Commonly known as: ADVIL   oxyCODONE-acetaminophen 5-325 MG tablet Commonly known as: PERCOCET/ROXICET     TAKE these medications   prenatal multivitamin Tabs tablet Take 1 tablet by mouth at bedtime.   ranitidine 150 MG tablet Commonly known as: ZANTAC Take 150 mg by mouth 2 (two) times daily as needed for heartburn.       Sharen Counter Certified Nurse-Midwife 05/29/2020 3:56 PM

## 2020-05-29 NOTE — MAU Note (Signed)
Pt reports that she was leaning over on the couch at 1100. She reports her three other children were playing when her daughter's foot with a shoe on kicked hard on the top of her belly.   Denies vaginal bleeding or LOF.   Reports +FM

## 2020-06-02 DIAGNOSIS — Z3685 Encounter for antenatal screening for Streptococcus B: Secondary | ICD-10-CM | POA: Diagnosis not present

## 2020-06-03 LAB — HERPES SIMPLEX VIRUS(HSV) DNA BY PCR
HSV 1 DNA: NEGATIVE
HSV 2 DNA: NEGATIVE

## 2020-06-17 ENCOUNTER — Encounter (HOSPITAL_COMMUNITY): Payer: Self-pay

## 2020-06-17 NOTE — Patient Instructions (Signed)
Yvonne Stopher  06/17/2020   Your procedure is scheduled on:  06/24/2020  Arrive at 1030 at Entrance C on [address] at [facility]  and [employer]. You are invited to use the FREE valet parking or use the Visitor's parking deck.  Pick up the phone at the desk and dial [phone].  Call this number if you have problems the morning of surgery: [phone]  Remember:   Do not eat food:(After Midnight) Desps de medianoche.  Do not drink clear liquids: (After Midnight) Desps de medianoche.  Take these medicines the morning of surgery with A SIP OF WATER:  none   Do not wear jewelry, make-up or nail polish.  Do not wear lotions, powders, or perfumes. Do not wear deodorant.  Do not shave 48 hours prior to surgery.  Do not bring valuables to the hospital.  [facility] is not   responsible for any belongings or valuables brought to the hospital.  Contacts, dentures or bridgework may not be worn into surgery.  Leave suitcase in the car. After surgery it may be brought to your room.  For patients admitted to the hospital, checkout time is 11:00 AM the day of              discharge.      Please read over the following fact sheets that you were given:     Preparing for Surgery

## 2020-06-22 ENCOUNTER — Other Ambulatory Visit (HOSPITAL_COMMUNITY)
Admission: RE | Admit: 2020-06-22 | Discharge: 2020-06-22 | Disposition: A | Discharge status: Home / Self Care | Payer: 59 | Source: Ambulatory Visit | Attending: Obstetrics and Gynecology | Admitting: Obstetrics and Gynecology

## 2020-06-22 ENCOUNTER — Other Ambulatory Visit: Payer: Self-pay

## 2020-06-22 ENCOUNTER — Encounter (HOSPITAL_COMMUNITY)
Admission: RE | Admit: 2020-06-22 | Discharge: 2020-06-22 | Disposition: A | Discharge status: Home / Self Care | Payer: 59 | Source: Ambulatory Visit | Attending: Obstetrics and Gynecology | Admitting: Obstetrics and Gynecology

## 2020-06-22 DIAGNOSIS — Z98891 History of uterine scar from previous surgery: Secondary | ICD-10-CM | POA: Insufficient documentation

## 2020-06-22 DIAGNOSIS — Z01812 Encounter for preprocedural laboratory examination: Secondary | ICD-10-CM | POA: Insufficient documentation

## 2020-06-22 DIAGNOSIS — Z20822 Contact with and (suspected) exposure to covid-19: Secondary | ICD-10-CM | POA: Insufficient documentation

## 2020-06-22 DIAGNOSIS — Z3A39 39 weeks gestation of pregnancy: Secondary | ICD-10-CM | POA: Diagnosis not present

## 2020-06-22 DIAGNOSIS — O99824 Streptococcus B carrier state complicating childbirth: Secondary | ICD-10-CM | POA: Diagnosis not present

## 2020-06-22 DIAGNOSIS — O34211 Maternal care for low transverse scar from previous cesarean delivery: Secondary | ICD-10-CM | POA: Diagnosis not present

## 2020-06-22 DIAGNOSIS — Z302 Encounter for sterilization: Secondary | ICD-10-CM | POA: Diagnosis not present

## 2020-06-22 LAB — CBC
HCT: 37.2 % (ref 36.0–46.0)
Hemoglobin: 12.2 g/dL (ref 12.0–15.0)
MCH: 30.3 pg (ref 26.0–34.0)
MCHC: 32.8 g/dL (ref 30.0–36.0)
MCV: 92.5 fL (ref 80.0–100.0)
Platelets: 187 10*3/uL (ref 150–400)
RBC: 4.02 MIL/uL (ref 3.87–5.11)
RDW: 13.2 % (ref 11.5–15.5)
WBC: 10.1 10*3/uL (ref 4.0–10.5)
nRBC: 0 % (ref 0.0–0.2)

## 2020-06-22 LAB — SARS CORONAVIRUS 2 (TAT 6-24 HRS): SARS Coronavirus 2: NEGATIVE

## 2020-06-22 LAB — TYPE AND SCREEN
ABO/RH(D): A NEG
Antibody Screen: POSITIVE

## 2020-06-23 LAB — RPR: RPR Ser Ql: NONREACTIVE

## 2020-06-24 ENCOUNTER — Inpatient Hospital Stay (HOSPITAL_COMMUNITY)
Admission: RE | Admit: 2020-06-24 | Discharge: 2020-06-26 | DRG: 785 | Disposition: A | Discharge status: Home / Self Care | Payer: 59 | Attending: Obstetrics and Gynecology | Admitting: Obstetrics and Gynecology

## 2020-06-24 ENCOUNTER — Encounter (HOSPITAL_COMMUNITY): Payer: Self-pay | Admitting: Obstetrics and Gynecology

## 2020-06-24 ENCOUNTER — Inpatient Hospital Stay (HOSPITAL_COMMUNITY): Payer: 59 | Admitting: Anesthesiology

## 2020-06-24 ENCOUNTER — Other Ambulatory Visit: Payer: Self-pay

## 2020-06-24 ENCOUNTER — Encounter (HOSPITAL_COMMUNITY)
Admission: RE | Disposition: A | Discharge status: Home / Self Care | Payer: Self-pay | Source: Home / Self Care | Attending: Obstetrics and Gynecology

## 2020-06-24 DIAGNOSIS — Z3A39 39 weeks gestation of pregnancy: Secondary | ICD-10-CM

## 2020-06-24 DIAGNOSIS — Z20822 Contact with and (suspected) exposure to covid-19: Secondary | ICD-10-CM | POA: Diagnosis present

## 2020-06-24 DIAGNOSIS — Z302 Encounter for sterilization: Secondary | ICD-10-CM

## 2020-06-24 DIAGNOSIS — O34211 Maternal care for low transverse scar from previous cesarean delivery: Principal | ICD-10-CM | POA: Diagnosis present

## 2020-06-24 DIAGNOSIS — O99824 Streptococcus B carrier state complicating childbirth: Secondary | ICD-10-CM | POA: Diagnosis present

## 2020-06-24 SURGERY — Surgical Case
Anesthesia: Spinal | Laterality: Bilateral

## 2020-06-24 MED ORDER — OXYCODONE-ACETAMINOPHEN 5-325 MG PO TABS: 1.0000 | ORAL_TABLET | ORAL | Status: DC | PRN

## 2020-06-24 MED ORDER — MENTHOL 3 MG MT LOZG: 1.0000 | LOZENGE | OROMUCOSAL | Status: DC | PRN

## 2020-06-24 MED ORDER — POVIDONE-IODINE 10 % EX SWAB
2.0000 "application " | Freq: Once | CUTANEOUS | Status: AC
  Administered 2020-06-24: 2 via TOPICAL

## 2020-06-24 MED ORDER — SODIUM CHLORIDE 0.9 % IV SOLN
2.0000 g | INTRAVENOUS | Status: AC
  Administered 2020-06-24: 2 g via INTRAVENOUS

## 2020-06-24 MED ORDER — ONDANSETRON HCL 4 MG/2ML IJ SOLN
INTRAMUSCULAR | Status: DC | PRN
  Administered 2020-06-24: 4 mg via INTRAVENOUS

## 2020-06-24 MED ORDER — SODIUM CHLORIDE 0.9 % IV SOLN: INTRAVENOUS | Status: DC | PRN

## 2020-06-24 MED ORDER — ZOLPIDEM TARTRATE 5 MG PO TABS: 5.0000 mg | ORAL_TABLET | Freq: Every evening | ORAL | Status: DC | PRN

## 2020-06-24 MED ORDER — NALOXONE HCL 4 MG/10ML IJ SOLN
1.0000 ug/kg/h | INTRAVENOUS | Status: DC | PRN
  Filled 2020-06-24: qty 5

## 2020-06-24 MED ORDER — SODIUM CHLORIDE 0.9% FLUSH: 3.0000 mL | INTRAVENOUS | Status: DC | PRN

## 2020-06-24 MED ORDER — ACETAMINOPHEN 325 MG PO TABS
650.0000 mg | ORAL_TABLET | ORAL | Status: DC | PRN
  Administered 2020-06-25 – 2020-06-26 (×4): 650 mg via ORAL
  Filled 2020-06-24 (×4): qty 2

## 2020-06-24 MED ORDER — KETOROLAC TROMETHAMINE 30 MG/ML IJ SOLN
30.0000 mg | Freq: Once | INTRAMUSCULAR | Status: AC | PRN
  Administered 2020-06-24: 30 mg via INTRAVENOUS

## 2020-06-24 MED ORDER — LACTATED RINGERS IV SOLN: INTRAVENOUS | Status: DC

## 2020-06-24 MED ORDER — PHENYLEPHRINE HCL-NACL 20-0.9 MG/250ML-% IV SOLN
INTRAVENOUS | Status: DC | PRN
  Administered 2020-06-24: 60 ug/min via INTRAVENOUS

## 2020-06-24 MED ORDER — FENTANYL CITRATE (PF) 100 MCG/2ML IJ SOLN
INTRAMUSCULAR | Status: AC
  Filled 2020-06-24: qty 2

## 2020-06-24 MED ORDER — HYDROMORPHONE HCL 1 MG/ML IJ SOLN: 0.2500 mg | INTRAMUSCULAR | Status: DC | PRN

## 2020-06-24 MED ORDER — PHENYLEPHRINE HCL-NACL 20-0.9 MG/250ML-% IV SOLN
INTRAVENOUS | Status: AC
  Filled 2020-06-24: qty 250

## 2020-06-24 MED ORDER — SODIUM CHLORIDE 0.9 % IV SOLN
INTRAVENOUS | Status: AC
  Filled 2020-06-24: qty 2

## 2020-06-24 MED ORDER — SODIUM CHLORIDE 0.9 % IR SOLN
Status: DC | PRN
  Administered 2020-06-24: 1

## 2020-06-24 MED ORDER — KETOROLAC TROMETHAMINE 30 MG/ML IJ SOLN
30.0000 mg | Freq: Four times a day (QID) | INTRAMUSCULAR | Status: AC | PRN
  Filled 2020-06-24: qty 1

## 2020-06-24 MED ORDER — NALBUPHINE HCL 10 MG/ML IJ SOLN: 5.0000 mg | Freq: Once | INTRAMUSCULAR | Status: DC | PRN

## 2020-06-24 MED ORDER — SIMETHICONE 80 MG PO CHEW
80.0000 mg | CHEWABLE_TABLET | Freq: Four times a day (QID) | ORAL | Status: DC | PRN
  Administered 2020-06-25 – 2020-06-26 (×4): 80 mg via ORAL
  Filled 2020-06-24 (×4): qty 1

## 2020-06-24 MED ORDER — DIPHENHYDRAMINE HCL 25 MG PO CAPS: 25.0000 mg | ORAL_CAPSULE | ORAL | Status: DC | PRN

## 2020-06-24 MED ORDER — IBUPROFEN 600 MG PO TABS
600.0000 mg | ORAL_TABLET | Freq: Four times a day (QID) | ORAL | Status: DC | PRN
  Administered 2020-06-25 – 2020-06-26 (×3): 600 mg via ORAL
  Filled 2020-06-24 (×2): qty 1

## 2020-06-24 MED ORDER — NALBUPHINE HCL 10 MG/ML IJ SOLN
5.0000 mg | Freq: Once | INTRAMUSCULAR | Status: DC | PRN
Start: 2020-06-24 — End: 2020-06-26

## 2020-06-24 MED ORDER — KETOROLAC TROMETHAMINE 30 MG/ML IJ SOLN
INTRAMUSCULAR | Status: AC
  Filled 2020-06-24: qty 1

## 2020-06-24 MED ORDER — STERILE WATER FOR IRRIGATION IR SOLN
Status: DC | PRN
  Administered 2020-06-24: 1

## 2020-06-24 MED ORDER — PROMETHAZINE HCL 25 MG/ML IJ SOLN: 6.2500 mg | INTRAMUSCULAR | Status: DC | PRN

## 2020-06-24 MED ORDER — ALUM & MAG HYDROXIDE-SIMETH 200-200-20 MG/5ML PO SUSP
30.0000 mL | ORAL | Status: DC | PRN
  Filled 2020-06-24: qty 30

## 2020-06-24 MED ORDER — ACETAMINOPHEN 500 MG PO TABS
1000.0000 mg | ORAL_TABLET | Freq: Four times a day (QID) | ORAL | Status: AC
  Administered 2020-06-24 – 2020-06-25 (×4): 1000 mg via ORAL
  Filled 2020-06-24 (×4): qty 2

## 2020-06-24 MED ORDER — OXYTOCIN-SODIUM CHLORIDE 30-0.9 UT/500ML-% IV SOLN
INTRAVENOUS | Status: DC | PRN
  Administered 2020-06-24: 30 [IU] via INTRAVENOUS

## 2020-06-24 MED ORDER — NALBUPHINE HCL 10 MG/ML IJ SOLN: 5.0000 mg | INTRAMUSCULAR | Status: DC | PRN

## 2020-06-24 MED ORDER — MORPHINE SULFATE (PF) 0.5 MG/ML IJ SOLN
INTRAMUSCULAR | Status: AC
  Filled 2020-06-24: qty 10

## 2020-06-24 MED ORDER — ONDANSETRON HCL 4 MG/2ML IJ SOLN
INTRAMUSCULAR | Status: AC
  Filled 2020-06-24: qty 2

## 2020-06-24 MED ORDER — MEPERIDINE HCL 25 MG/ML IJ SOLN: 6.2500 mg | INTRAMUSCULAR | Status: DC | PRN

## 2020-06-24 MED ORDER — KETOROLAC TROMETHAMINE 30 MG/ML IJ SOLN
30.0000 mg | Freq: Four times a day (QID) | INTRAMUSCULAR | Status: AC | PRN
  Administered 2020-06-24 – 2020-06-25 (×2): 30 mg via INTRAVENOUS
  Filled 2020-06-24: qty 1

## 2020-06-24 MED ORDER — BUPIVACAINE IN DEXTROSE 0.75-8.25 % IT SOLN
INTRATHECAL | Status: DC | PRN
  Administered 2020-06-24: 1.4 mL via INTRATHECAL

## 2020-06-24 MED ORDER — ONDANSETRON HCL 4 MG/2ML IJ SOLN: 4.0000 mg | Freq: Three times a day (TID) | INTRAMUSCULAR | Status: DC | PRN

## 2020-06-24 MED ORDER — MORPHINE SULFATE (PF) 0.5 MG/ML IJ SOLN
INTRAMUSCULAR | Status: DC | PRN
  Administered 2020-06-24: 150 ug via INTRATHECAL

## 2020-06-24 MED ORDER — DIPHENHYDRAMINE HCL 50 MG/ML IJ SOLN: 12.5000 mg | INTRAMUSCULAR | Status: DC | PRN

## 2020-06-24 MED ORDER — DEXTROSE-NACL 5-0.45 % IV SOLN: INTRAVENOUS | Status: DC

## 2020-06-24 MED ORDER — FENTANYL CITRATE (PF) 100 MCG/2ML IJ SOLN
INTRAMUSCULAR | Status: DC | PRN
  Administered 2020-06-24: 15 ug via INTRATHECAL

## 2020-06-24 MED ORDER — SCOPOLAMINE 1 MG/3DAYS TD PT72: 1.0000 | MEDICATED_PATCH | Freq: Once | TRANSDERMAL | Status: DC

## 2020-06-24 MED ORDER — NALOXONE HCL 0.4 MG/ML IJ SOLN: 0.4000 mg | INTRAMUSCULAR | Status: DC | PRN

## 2020-06-24 SURGICAL SUPPLY — 28 items
CLAMP CORD UMBIL (MISCELLANEOUS) IMPLANT
CLIP FILSHIE TUBAL LIGA STRL (Clip) ×6 IMPLANT
CLOTH BEACON ORANGE TIMEOUT ST (SAFETY) ×3 IMPLANT
DRSG OPSITE POSTOP 4X10 (GAUZE/BANDAGES/DRESSINGS) ×3 IMPLANT
ELECT REM PT RETURN 9FT ADLT (ELECTROSURGICAL) ×3
ELECTRODE REM PT RTRN 9FT ADLT (ELECTROSURGICAL) ×1 IMPLANT
EXTRACTOR VACUUM M CUP 4 TUBE (SUCTIONS) IMPLANT
EXTRACTOR VACUUM M CUP 4' TUBE (SUCTIONS)
GLOVE BIOGEL PI IND STRL 7.0 (GLOVE) ×1 IMPLANT
GLOVE BIOGEL PI INDICATOR 7.0 (GLOVE) ×2
GLOVE SURG ORTHO 8.0 STRL STRW (GLOVE) ×3 IMPLANT
GOWN STRL REUS W/TWL LRG LVL3 (GOWN DISPOSABLE) ×6 IMPLANT
KIT ABG SYR 3ML LUER SLIP (SYRINGE) ×3 IMPLANT
NEEDLE HYPO 25X5/8 SAFETYGLIDE (NEEDLE) ×3 IMPLANT
NS IRRIG 1000ML POUR BTL (IV SOLUTION) ×3 IMPLANT
PACK C SECTION WH (CUSTOM PROCEDURE TRAY) ×3 IMPLANT
PAD OB MATERNITY 4.3X12.25 (PERSONAL CARE ITEMS) ×3 IMPLANT
PENCIL SMOKE EVAC W/HOLSTER (ELECTROSURGICAL) ×3 IMPLANT
SUT MNCRL 0 VIOLET CTX 36 (SUTURE) ×3 IMPLANT
SUT MON AB 4-0 PS1 27 (SUTURE) ×3 IMPLANT
SUT MONOCRYL 0 CTX 36 (SUTURE) ×6
SUT PDS AB 1 CT  36 (SUTURE)
SUT PDS AB 1 CT 36 (SUTURE) IMPLANT
SUT VIC AB 1 CTX 36 (SUTURE)
SUT VIC AB 1 CTX36XBRD ANBCTRL (SUTURE) IMPLANT
TOWEL OR 17X24 6PK STRL BLUE (TOWEL DISPOSABLE) ×3 IMPLANT
TRAY FOLEY W/BAG SLVR 14FR LF (SET/KITS/TRAYS/PACK) ×3 IMPLANT
WATER STERILE IRR 1000ML POUR (IV SOLUTION) ×3 IMPLANT

## 2020-06-24 NOTE — Transfer of Care (Signed)
Immediate Anesthesia Transfer of Care Note  Patient: Lynn Zamora  Procedure(s) Performed: REPEAT CESAREAN SECTION WITH BILATERAL TUBAL LIGATION EDC: 06-28-20 ALLERG: NKDA (Bilateral)  Patient Location: PACU  Anesthesia Type:Spinal  Level of Consciousness: awake  Airway & Oxygen Therapy: Patient Spontanous Breathing  Post-op Assessment: Report given to RN and Post -op Vital signs reviewed and stable  Post vital signs: Reviewed and stable  Last Vitals:  Vitals Value Taken Time  BP    Temp    Pulse    Resp    SpO2      Last Pain:  Vitals:   06/24/20 1059  TempSrc: Oral  PainSc: 0-No pain      Patients Stated Pain Goal: 4 (06/24/20 1059)  Complications: No notable events documented.

## 2020-06-24 NOTE — Anesthesia Preprocedure Evaluation (Signed)
Anesthesia Evaluation  Patient identified by MRN, date of birth, ID band Patient awake    Reviewed: Allergy & Precautions, H&P , NPO status , Patient's Chart, lab work & pertinent test results  Airway Mallampati: I       Dental no notable dental hx.    Pulmonary neg pulmonary ROS,    Pulmonary exam normal        Cardiovascular negative cardio ROS Normal cardiovascular exam     Neuro/Psych negative neurological ROS  negative psych ROS   GI/Hepatic negative GI ROS, Neg liver ROS,   Endo/Other  negative endocrine ROS  Renal/GU negative Renal ROS  negative genitourinary   Musculoskeletal negative musculoskeletal ROS (+)   Abdominal (+) + obese,   Peds  Hematology negative hematology ROS (+)   Anesthesia Other Findings   Reproductive/Obstetrics (+) Pregnancy                             Anesthesia Physical Anesthesia Plan  ASA: 2  Anesthesia Plan: Spinal   Post-op Pain Management:    Induction:   PONV Risk Score and Plan: 3 and Ondansetron, Dexamethasone and Scopolamine patch - Pre-op  Airway Management Planned: Natural Airway, Nasal Cannula and Simple Face Mask  Additional Equipment: None  Intra-op Plan:   Post-operative Plan:   Informed Consent: I have reviewed the patients History and Physical, chart, labs and discussed the procedure including the risks, benefits and alternatives for the proposed anesthesia with the patient or authorized representative who has indicated his/her understanding and acceptance.       Plan Discussed with: CRNA  Anesthesia Plan Comments:         Anesthesia Quick Evaluation

## 2020-06-24 NOTE — Anesthesia Procedure Notes (Signed)
Spinal  Patient location during procedure: OR Start time: 06/24/2020 12:45 PM End time: 06/24/2020 12:48 PM Reason for block: surgical anesthesia Staffing Performed: anesthesiologist  Anesthesiologist: Leilani Able, MD Preanesthetic Checklist Completed: patient identified, IV checked, site marked, risks and benefits discussed, surgical consent, monitors and equipment checked, pre-op evaluation and timeout performed Spinal Block Patient position: sitting Prep: DuraPrep and site prepped and draped Patient monitoring: continuous pulse ox and blood pressure Approach: midline Location: L3-4 Injection technique: single-shot Needle Needle type: Pencan  Needle gauge: 24 G Needle length: 10 cm Needle insertion depth: 7 cm Assessment Sensory level: T4 Events: CSF return

## 2020-06-24 NOTE — H&P (Addendum)
Lynn Zamora is a 30 y.o. female presenting for repeat C/S and BTL.  Pregnancy uncomplicated.  GBS+. OB History     Gravida  3   Para  2   Term  2   Preterm      AB      Living  3      SAB      IAB      Ectopic      Multiple  1   Live Births  3          History reviewed. No pertinent past medical history. Past Surgical History:  Procedure Laterality Date   CESAREAN SECTION MULTI-GESTATIONAL N/A 12/06/2016   Procedure: CESAREAN SECTION MULTI-GESTATIONAL;  Surgeon: Mitchel Honour, DO;  Location: [facility];  Service: Obstetrics;  Laterality: N/A;  Primary edc 12/27/16 NKDA need RNFA   DILATION AND CURETTAGE OF UTERUS N/A 08/17/2015   Procedure: DILATATION AND CURETTAGE;  Surgeon: Zelphia Cairo, MD;  Location: [facility];  Service: Gynecology;  Laterality: N/A;   HERNIA REPAIR     WISDOM TOOTH EXTRACTION     Family History: family history includes Heart disease in her maternal grandfather; Hypertension in her mother; Lung cancer in her paternal grandfather. Social History:  reports that she has never smoked. She has never used smokeless tobacco. She reports that she does not drink alcohol and does not use drugs.     Maternal Diabetes: No Genetic Screening: Normal Maternal Ultrasounds/Referrals: Normal Fetal Ultrasounds or other Referrals:  None Maternal Substance Abuse:  No Significant Maternal Medications:  None Significant Maternal Lab Results:  Group B Strep positive Other Comments:  None  Review of Systems History   Blood pressure (!) 158/87, pulse (!) 104, temperature 98.6 F (37 C), temperature source Oral, resp. rate 20, height 5\' 3"  (1.6 m), weight 87.5 kg, SpO2 100 %, not currently breastfeeding. Exam Physical Exam  Vitals and nursing note reviewed. Exam conducted with a chaperone present.  Constitutional:      Appearance: Normal appearance.  HENT:     Head: Normocephalic.  Eyes:     Pupils: Pupils are equal, round, and reactive to  light.  Cardiovascular:     Rate and Rhythm: Normal rate and regular rhythm.     Pulses: Normal pulses.  Abdominal:     General: Abdomen is Gravid, nontender Neurological:     Mental Status: She is alert.  Prenatal labs: ABO, Rh: --/--/A NEG (06/08 1009) Antibody: POS (06/08 1009) Rubella: Immune (11/05 0000) RPR: NON REACTIVE (06/08 1004)  HBsAg: Negative (11/05 0000)  HIV: Non-reactive (11/05 0000)  GBS:     Assessment/Plan: IUP at 39 weeks Prev C/S desires repeat and requests sterilization Risks and benefits of C/S were discussed.  All questions were answered and informed consent was obtained.  Plan to proceed with low segment transverse Cesarean Section and BTL with filshie clips.  3-05/998 failure rate discussed.

## 2020-06-24 NOTE — Op Note (Signed)
Cesarean Section Procedure Note  Pre-operative Diagnosis: IUP at 39 weeks, previous c/s for repeat, desires sterilization  Post-operative Diagnosis: same  Surgeon: Turner Daniels   Assistants: IUP  Anesthesia: spinal  Procedure:  Low Segment Transverse cesarean section and sterilization with filshie clips  Procedure Details  The patient was seen in the Holding Room. The risks, benefits, complications, treatment options, and expected outcomes were discussed with the patient.  The patient concurred with the proposed plan, giving informed consent.  The site of surgery properly noted/marked.. A Time Out was held and the above information confirmed.  After induction of anesthesia, the patient was draped and prepped in the usual sterile manner. A Pfannenstiel incision was made and carried down through the subcutaneous tissue to the fascia. Fascial incision was made and extended transversely. The fascia was separated from the underlying rectus tissue superiorly and inferiorly. The peritoneum was identified and entered. Peritoneal incision was extended longitudinally. The utero-vesical peritoneal reflection was incised transversely and the bladder flap was bluntly freed from the lower uterine segment. A low transverse uterine incision was made. Delivered from vertex presentation was a baby with Apgar scores of 9 at one minute and 9 at five minutes. After the umbilical cord was clamped and cut cord blood was obtained for evaluation. The placenta was removed intact and appeared normal. The uterine outline, tubes and ovaries appeared normal. The uterine incision was closed with running locked sutures of 0 monocryl and imbricated with 0 monocryl. Hemostasis was observed. Lavage was carried out until clear. The fallopian tubes were identified by their fimbriated ends and filshie clips were placed across the tubes at the midportion of each tube.  Good placement was noted bilaterally.  The peritoneum was then closed  with 0 monocryl and rectus muscles plicated in the midline.  After hemostasis was assured, the fascia was then reapproximated with running sutures of 0 Vicryl. Irrigation was applied and after adequate hemostasis was assured, the skin was reapproximated with subcutaneous sutures using 4-0 monocryl.  Instrument, sponge, and needle counts were correct prior the abdominal closure and at the conclusion of the case. The patient received 2 grams cefotetan preoperatively.  Findings: Viable  female  Estimated Blood Loss:  366cc         Specimens: Placenta was sent to labor and delivery         Complications:  None

## 2020-06-25 LAB — CBC
HCT: 33 % — ABNORMAL LOW (ref 36.0–46.0)
Hemoglobin: 10.9 g/dL — ABNORMAL LOW (ref 12.0–15.0)
MCH: 30.7 pg (ref 26.0–34.0)
MCHC: 33 g/dL (ref 30.0–36.0)
MCV: 93 fL (ref 80.0–100.0)
Platelets: 138 10*3/uL — ABNORMAL LOW (ref 150–400)
RBC: 3.55 MIL/uL — ABNORMAL LOW (ref 3.87–5.11)
RDW: 13.3 % (ref 11.5–15.5)
WBC: 11.5 10*3/uL — ABNORMAL HIGH (ref 4.0–10.5)
nRBC: 0 % (ref 0.0–0.2)

## 2020-06-25 MED ORDER — OXYCODONE HCL 5 MG PO TABS
10.0000 mg | ORAL_TABLET | ORAL | Status: DC | PRN
  Administered 2020-06-25 – 2020-06-26 (×4): 10 mg via ORAL
  Filled 2020-06-25 (×4): qty 2

## 2020-06-25 MED ORDER — OXYCODONE HCL 5 MG PO TABS
5.0000 mg | ORAL_TABLET | ORAL | Status: DC | PRN
  Administered 2020-06-25: 5 mg via ORAL
  Filled 2020-06-25: qty 1

## 2020-06-25 MED ORDER — RHO D IMMUNE GLOBULIN 1500 UNIT/2ML IJ SOSY
300.0000 ug | PREFILLED_SYRINGE | Freq: Once | INTRAMUSCULAR | Status: AC
  Administered 2020-06-25: 300 ug via INTRAVENOUS
  Filled 2020-06-25: qty 2

## 2020-06-25 NOTE — Progress Notes (Signed)
Subjective: Postpartum Day 1: Cesarean Delivery Patient reports tolerating PO, + flatus, and no problems voiding.    Objective: Vital signs in last 24 hours: Temp:  [97.6 F (36.4 C)-98.9 F (37.2 C)] 97.7 F (36.5 C) (06/11 0442) Pulse Rate:  [71-104] 71 (06/11 0442) Resp:  [15-21] 18 (06/11 0442) BP: (111-158)/(60-87) 116/86 (06/11 0442) SpO2:  [96 %-100 %] 98 % (06/11 0442) Weight:  [87.5 kg] 87.5 kg (06/10 1059)  Physical Exam:  General: alert, cooperative, appears stated age, and no distress Lochia: appropriate Uterine Fundus: firm Incision: healing well DVT Evaluation: No evidence of DVT seen on physical exam.  Recent Labs    06/22/20 1004 06/25/20 0413  HGB 12.2 10.9*  HCT 37.2 33.0*    Assessment/Plan: Status post Cesarean section. Doing well postoperatively.  Continue current care No circumcision per parents.  Turner Daniels 06/25/2020, 9:17 AM

## 2020-06-26 LAB — RH IG WORKUP (INCLUDES ABO/RH)
Fetal Screen: NEGATIVE
Gestational Age(Wks): 39
Unit division: 0

## 2020-06-26 MED ORDER — OXYCODONE-ACETAMINOPHEN 5-325 MG PO TABS: 1.0000 | ORAL_TABLET | Freq: Four times a day (QID) | ORAL | 0 refills | Status: AC | PRN

## 2020-06-26 MED ORDER — IBUPROFEN 600 MG PO TABS: 600.0000 mg | ORAL_TABLET | Freq: Four times a day (QID) | ORAL | 0 refills | Status: AC | PRN

## 2020-06-26 NOTE — Discharge Summary (Signed)
Postpartum Discharge Summary       Patient Name: Lynn Zamora DOB: 1990-11-13 MRN: [id number]  Date of admission: 06/24/2020 Delivery date:06/24/2020  Delivering provider: Louretta Shorten  Date of discharge: 06/26/2020  Admitting diagnosis: Cesarean delivery delivered [O82] Intrauterine pregnancy: [redacted]w[redacted]d    Secondary diagnosis:  Active Problems:   Cesarean delivery delivered  Additional problems:      Discharge diagnosis: Term Pregnancy Delivered                                              Post partum procedures:   Augmentation: N/A Complications: None  Hospital course: Sceduled C/S   30y.o. yo [id number] at 30w3das admitted to the hospital 06/24/2020 for scheduled cesarean section with the following indication:Elective Repeat.Delivery details are as follows:  Membrane Rupture Time/Date: 1:11 PM ,06/24/2020   Delivery Method:C-Section, Low Transverse  Details of operation can be found in separate operative note.  Patient had an uncomplicated postpartum course.  She is ambulating, tolerating a regular diet, passing flatus, and urinating well. Patient is discharged home in stable condition on  06/26/20        Newborn Data: Birth date:06/24/2020  Birth time:1:11 PM  Gender:Female  Living status:Living  Apgars:8 ,9  Weight:3510 g     Magnesium Sulfate received: No BMZ received: No Rhophylac:N/A MMR:N/A T-DaP:Given prenatally Flu: Yes Transfusion:No  Physical exam  Vitals:   06/24/20 2302 06/25/20 0442 06/25/20 2035 06/26/20 0402  BP: 113/68 116/86 120/84 128/84  Pulse: 73 71 67 75  Resp: 18 18 18 18   Temp: 98.5 F (36.9 C) 97.7 F (36.5 C) 97.9 F (36.6 C) 97.7 F (36.5 C)  TempSrc: Oral Oral Oral Oral  SpO2:  98% 98%   Weight:      Height:       General: alert, cooperative, and no distress Lochia: appropriate Uterine Fundus: firm Incision: Healing well with no significant drainage DVT Evaluation: No evidence of DVT seen on physical exam. Labs: Lab Results   Component Value Date   WBC 11.5 (H) 06/25/2020   HGB 10.9 (L) 06/25/2020   HCT 33.0 (L) 06/25/2020   MCV 93.0 06/25/2020   PLT 138 (L) 06/25/2020   No flowsheet data found. Edinburgh Score: Edinburgh Postnatal Depression Scale Screening Tool 06/25/2020  I have been able to laugh and see the funny side of things. 0  I have looked forward with enjoyment to things. 0  I have blamed myself unnecessarily when things went wrong. 0  I have been anxious or worried for no good reason. 0  I have felt scared or panicky for no good reason. 0  Things have been getting on top of me. 0  I have been so unhappy that I have had difficulty sleeping. 0  I have felt sad or miserable. 0  I have been so unhappy that I have been crying. 0  The thought of harming myself has occurred to me. 0  Edinburgh Postnatal Depression Scale Total 0      After visit meds:  Allergies as of 06/26/2020   No Known Allergies      Medication List     TAKE these medications    ibuprofen 600 MG tablet Commonly known as: ADVIL Take 1 tablet (600 mg total) by mouth every 6 (six) hours as needed for mild pain or moderate pain.  oxyCODONE-acetaminophen 5-325 MG tablet Commonly known as: Percocet Take 1-2 tablets by mouth every 6 (six) hours as needed for severe pain.   prenatal multivitamin Tabs tablet Take 1 tablet by mouth at bedtime.   ranitidine 150 MG tablet Commonly known as: ZANTAC Take 150 mg by mouth 2 (two) times daily as needed for heartburn.         Discharge home in stable condition Infant Feeding: Bottle Infant Disposition:home with mother Discharge instruction: per After Visit Summary and Postpartum booklet. Activity: Advance as tolerated. Pelvic rest for 6 weeks.  Diet: routine diet Anticipated Birth Control: BTL done PP Postpartum Appointment:6 weeks Additional Postpartum F/U:    Future Appointments:No future appointments. Follow up Visit:      06/26/2020 Luz Lex, MD

## 2020-06-27 NOTE — Anesthesia Postprocedure Evaluation (Signed)
Anesthesia Post Note  Patient: Lynn Zamora  Procedure(s) Performed: REPEAT CESAREAN SECTION WITH BILATERAL TUBAL LIGATION EDC: 06-28-20 ALLERG: NKDA (Bilateral)     Patient location during evaluation: PACU Anesthesia Type: Spinal Level of consciousness: awake Pain management: pain level controlled Vital Signs Assessment: post-procedure vital signs reviewed and stable Respiratory status: spontaneous breathing Cardiovascular status: stable Postop Assessment: no headache, no backache, spinal receding, patient able to bend at knees and no apparent nausea or vomiting Anesthetic complications: no   No notable events documented.  Last Vitals:  Vitals:   06/25/20 2035 06/26/20 0402  BP: 120/84 128/84  Pulse: 67 75  Resp: 18 18  Temp: 36.6 C 36.5 C  SpO2: 98%     Last Pain:  Vitals:   06/26/20 0815  TempSrc:   PainSc: 0-No pain   Pain Goal: Patients Stated Pain Goal: 4 (06/24/20 1059)                 Caren Macadam

## 2021-04-04 DIAGNOSIS — J029 Acute pharyngitis, unspecified: Secondary | ICD-10-CM | POA: Diagnosis not present

## 2021-08-10 DIAGNOSIS — Z1151 Encounter for screening for human papillomavirus (HPV): Secondary | ICD-10-CM | POA: Diagnosis not present

## 2021-08-10 DIAGNOSIS — Z124 Encounter for screening for malignant neoplasm of cervix: Secondary | ICD-10-CM | POA: Diagnosis not present

## 2021-08-10 DIAGNOSIS — Z01419 Encounter for gynecological examination (general) (routine) without abnormal findings: Secondary | ICD-10-CM | POA: Diagnosis not present

## 2021-08-10 DIAGNOSIS — Z6830 Body mass index (BMI) 30.0-30.9, adult: Secondary | ICD-10-CM | POA: Diagnosis not present

## 2021-08-20 DIAGNOSIS — J019 Acute sinusitis, unspecified: Secondary | ICD-10-CM | POA: Diagnosis not present

## 2021-10-15 DIAGNOSIS — H6691 Otitis media, unspecified, right ear: Secondary | ICD-10-CM | POA: Diagnosis not present

## 2021-10-21 DIAGNOSIS — H66003 Acute suppurative otitis media without spontaneous rupture of ear drum, bilateral: Secondary | ICD-10-CM | POA: Diagnosis not present

## 2021-10-21 DIAGNOSIS — H60393 Other infective otitis externa, bilateral: Secondary | ICD-10-CM | POA: Diagnosis not present

## 2021-10-24 DIAGNOSIS — H60509 Unspecified acute noninfective otitis externa, unspecified ear: Secondary | ICD-10-CM | POA: Diagnosis not present

## 2021-10-24 DIAGNOSIS — H6123 Impacted cerumen, bilateral: Secondary | ICD-10-CM | POA: Diagnosis not present

## 2023-04-18 DIAGNOSIS — H6692 Otitis media, unspecified, left ear: Secondary | ICD-10-CM | POA: Diagnosis not present

## 2023-04-18 DIAGNOSIS — H9202 Otalgia, left ear: Secondary | ICD-10-CM | POA: Diagnosis not present

## 2023-05-13 DIAGNOSIS — R070 Pain in throat: Secondary | ICD-10-CM | POA: Diagnosis not present

## 2023-05-13 DIAGNOSIS — R509 Fever, unspecified: Secondary | ICD-10-CM | POA: Diagnosis not present

## 2023-05-13 DIAGNOSIS — J029 Acute pharyngitis, unspecified: Secondary | ICD-10-CM | POA: Diagnosis not present

## 2023-06-14 DIAGNOSIS — Z01419 Encounter for gynecological examination (general) (routine) without abnormal findings: Secondary | ICD-10-CM | POA: Diagnosis not present

## 2023-06-14 DIAGNOSIS — Z6827 Body mass index (BMI) 27.0-27.9, adult: Secondary | ICD-10-CM | POA: Diagnosis not present
